# Patient Record
Sex: Male | Born: 1955 | Race: White | Hispanic: No | Smoking: Never smoker
Health system: Southern US, Community
[De-identification: ages and names within clinical notes are randomized; demographics above are authoritative.]

## PROBLEM LIST (undated history)

## (undated) DIAGNOSIS — E119 Type 2 diabetes mellitus without complications: Secondary | ICD-10-CM

## (undated) DIAGNOSIS — H269 Unspecified cataract: Secondary | ICD-10-CM

## (undated) DIAGNOSIS — M1990 Unspecified osteoarthritis, unspecified site: Secondary | ICD-10-CM

## (undated) DIAGNOSIS — I10 Essential (primary) hypertension: Secondary | ICD-10-CM

## (undated) DIAGNOSIS — E785 Hyperlipidemia, unspecified: Secondary | ICD-10-CM

## (undated) DIAGNOSIS — T7840XA Allergy, unspecified, initial encounter: Secondary | ICD-10-CM

## (undated) DIAGNOSIS — J40 Bronchitis, not specified as acute or chronic: Secondary | ICD-10-CM

## (undated) HISTORY — DX: Type 2 diabetes mellitus without complications: E11.9

## (undated) HISTORY — DX: Hyperlipidemia, unspecified: E78.5

## (undated) HISTORY — DX: Essential (primary) hypertension: I10

## (undated) HISTORY — PX: OTHER SURGICAL HISTORY: SHX169

## (undated) HISTORY — DX: Unspecified osteoarthritis, unspecified site: M19.90

## (undated) HISTORY — PX: WISDOM TOOTH EXTRACTION: SHX21

## (undated) HISTORY — DX: Allergy, unspecified, initial encounter: T78.40XA

## (undated) HISTORY — DX: Unspecified cataract: H26.9

---

## 2012-03-08 ENCOUNTER — Other Ambulatory Visit: Payer: Self-pay | Admitting: Family Medicine

## 2012-03-08 DIAGNOSIS — M79669 Pain in unspecified lower leg: Secondary | ICD-10-CM

## 2012-03-10 ENCOUNTER — Ambulatory Visit
Admission: RE | Admit: 2012-03-10 | Discharge: 2012-03-10 | Disposition: A | Discharge status: Home / Self Care | Payer: 59 | Source: Ambulatory Visit | Attending: Family Medicine | Admitting: Family Medicine

## 2012-03-10 DIAGNOSIS — M79669 Pain in unspecified lower leg: Secondary | ICD-10-CM

## 2013-06-05 ENCOUNTER — Encounter: Payer: Self-pay | Admitting: Internal Medicine

## 2013-08-03 ENCOUNTER — Encounter: Payer: 59 | Admitting: Internal Medicine

## 2014-04-12 ENCOUNTER — Other Ambulatory Visit: Payer: Self-pay | Admitting: Family Medicine

## 2014-04-12 DIAGNOSIS — G5691 Unspecified mononeuropathy of right upper limb: Secondary | ICD-10-CM

## 2014-04-22 ENCOUNTER — Ambulatory Visit
Admission: RE | Admit: 2014-04-22 | Discharge: 2014-04-22 | Disposition: A | Discharge status: Home / Self Care | Payer: 59 | Source: Ambulatory Visit | Attending: Family Medicine | Admitting: Family Medicine

## 2014-04-22 DIAGNOSIS — G5691 Unspecified mononeuropathy of right upper limb: Secondary | ICD-10-CM

## 2014-12-31 DIAGNOSIS — M79604 Pain in right leg: Secondary | ICD-10-CM | POA: Insufficient documentation

## 2014-12-31 DIAGNOSIS — G5691 Unspecified mononeuropathy of right upper limb: Secondary | ICD-10-CM | POA: Insufficient documentation

## 2014-12-31 DIAGNOSIS — I10 Essential (primary) hypertension: Secondary | ICD-10-CM | POA: Diagnosis present

## 2015-05-26 HISTORY — PX: COLONOSCOPY: SHX174

## 2016-01-14 ENCOUNTER — Ambulatory Visit (AMBULATORY_SURGERY_CENTER): Payer: Self-pay

## 2016-01-14 VITALS — Ht 74.0 in | Wt 285.8 lb

## 2016-01-14 DIAGNOSIS — Z800 Family history of malignant neoplasm of digestive organs: Secondary | ICD-10-CM

## 2016-01-14 MED ORDER — NA SULFATE-K SULFATE-MG SULF 17.5-3.13-1.6 GM/177ML PO SOLN: ORAL | 0 refills | Status: DC

## 2016-01-14 NOTE — Progress Notes (Signed)
Per pt, no allergies to soy or egg products.Pt not taking any weight loss meds or using  O2 at home. 

## 2016-01-16 ENCOUNTER — Encounter: Payer: Self-pay | Admitting: Gastroenterology

## 2016-01-28 ENCOUNTER — Encounter: Payer: Self-pay | Admitting: Gastroenterology

## 2016-01-28 ENCOUNTER — Ambulatory Visit (AMBULATORY_SURGERY_CENTER): Payer: 59 | Admitting: Gastroenterology

## 2016-01-28 VITALS — BP 110/57 | HR 51 | Temp 99.3°F | Resp 16 | Ht 74.0 in | Wt 285.0 lb

## 2016-01-28 DIAGNOSIS — D124 Benign neoplasm of descending colon: Secondary | ICD-10-CM

## 2016-01-28 DIAGNOSIS — D125 Benign neoplasm of sigmoid colon: Secondary | ICD-10-CM | POA: Diagnosis not present

## 2016-01-28 DIAGNOSIS — D123 Benign neoplasm of transverse colon: Secondary | ICD-10-CM | POA: Diagnosis not present

## 2016-01-28 DIAGNOSIS — Z1211 Encounter for screening for malignant neoplasm of colon: Secondary | ICD-10-CM

## 2016-01-28 DIAGNOSIS — Z800 Family history of malignant neoplasm of digestive organs: Secondary | ICD-10-CM

## 2016-01-28 DIAGNOSIS — D120 Benign neoplasm of cecum: Secondary | ICD-10-CM

## 2016-01-28 MED ORDER — SODIUM CHLORIDE 0.9 % IV SOLN: 500.0000 mL | INTRAVENOUS | Status: DC

## 2016-01-28 NOTE — Patient Instructions (Signed)
YOU HAD AN ENDOSCOPIC PROCEDURE TODAY AT [facility]:   Refer to the procedure report that was given to you for any specific questions about what was found during the examination.  If the procedure report does not answer your questions, please call your gastroenterologist to clarify.  If you requested that your care partner not be given the details of your procedure findings, then the procedure report has been included in a sealed envelope for you to review at your convenience later.  YOU SHOULD EXPECT: Some feelings of bloating in the abdomen. Passage of more gas than usual.  Walking can help get rid of the air that was put into your GI tract during the procedure and reduce the bloating. If you had a lower endoscopy (such as a colonoscopy or flexible sigmoidoscopy) you may notice spotting of blood in your stool or on the toilet paper. If you underwent a bowel prep for your procedure, you may not have a normal bowel movement for a few days.  Please Note:  You might notice some irritation and congestion in your nose or some drainage.  This is from the oxygen used during your procedure.  There is no need for concern and it should clear up in a day or so.  SYMPTOMS TO REPORT IMMEDIATELY:   Following lower endoscopy (colonoscopy or flexible sigmoidoscopy):  Excessive amounts of blood in the stool  Significant tenderness or worsening of abdominal pains  Swelling of the abdomen that is new, acute  Fever of 100F or higher   For urgent or emergent issues, a gastroenterologist can be reached at any hour by calling [phone].   DIET:  We do recommend a small meal at first, but then you may proceed to your regular diet.  Drink plenty of fluids but you should avoid alcoholic beverages for 24 hours. Try to increase your fiber intake due to your Diverticulosis.  Drink plenty of water.  ACTIVITY:  You should plan to take it easy for the rest of today and you should NOT DRIVE or use heavy  machinery until tomorrow (because of the sedation medicines used during the test).    FOLLOW UP: Our staff will call the number listed on your records the next business day following your procedure to check on you and address any questions or concerns that you may have regarding the information given to you following your procedure. If we do not reach you, we will leave a message.  However, if you are feeling well and you are not experiencing any problems, there is no need to return our call.  We will assume that you have returned to your regular daily activities without incident.  If any biopsies were taken you will be contacted by phone or by letter within the next 1-3 weeks.  Please call us at [phone] if you have not heard about the biopsies in 3 weeks.    SIGNATURES/CONFIDENTIALITY: You and/or your care partner have signed paperwork which will be entered into your electronic medical record.  These signatures attest to the fact that that the information above on your After Visit Summary has been reviewed and is understood.  Full responsibility of the confidentiality of this discharge information lies with you and/or your care-partner.  No Aspirin, Aleve or Ibuprofen for two weeks per Dr. Havery [name] due to the fact that he took one larger polyp out with cautery or heat.  We don't want the area to bleed. Thank-you for choosing [facility] for your  healthcare today.

## 2016-01-28 NOTE — Progress Notes (Signed)
A/ox3 pleased with MAC, report to Suzanne RN 

## 2016-01-28 NOTE — Progress Notes (Signed)
Called to room to assist during endoscopic procedure.  Patient ID and intended procedure confirmed with present staff. Received instructions for my participation in the procedure from the performing physician.  

## 2016-01-28 NOTE — Op Note (Signed)
[facility] Patient Name: Albert Mayer Procedure Date: 01/28/2016 1:55 PM MRN: [id number] Endoscopist: Remo Lipps P. Havery [name] , MD Age: 60 Referring MD:  Date of Birth: 02-24-1956 Gender: Male Account #: [id number] Procedure:                Colonoscopy Indications:              Screening patient at increased risk: Family history                            of 1st-degree relative (brother) with colorectal                            cancer diagnosed at age 24 years, This is the                            patient's first colonoscopy Medicines:                Monitored Anesthesia Care Procedure:                Pre-Anesthesia Assessment:                           - Prior to the procedure, a History and Physical                            was performed, and patient medications and                            allergies were reviewed. The patient's tolerance of                            previous anesthesia was also reviewed. The risks                            and benefits of the procedure and the sedation                            options and risks were discussed with the patient.                            All questions were answered, and informed consent                            was obtained. Prior Anticoagulants: The patient has                            taken aspirin, last dose was 1 day prior to                            procedure. ASA Grade Assessment: II - A patient                            with mild systemic disease. After reviewing the  risks and benefits, the patient was deemed in                            satisfactory condition to undergo the procedure.                           After obtaining informed consent, the colonoscope                            was passed under direct vision. Throughout the                            procedure, the patient's blood pressure, pulse, and                            oxygen saturations were monitored  continuously. The                            [id number]) was introduced through the anus                            and advanced to the the cecum, identified by                            appendiceal orifice and ileocecal valve. The                            colonoscopy was performed without difficulty. The                            patient tolerated the procedure well. The quality                            of the bowel preparation was adequate. The                            ileocecal valve, appendiceal orifice, and rectum                            were photographed. Scope In: 1:59:19 PM Scope Out: 2:26:10 PM Scope Withdrawal Time: 0 hours 22 minutes 7 seconds  Total Procedure Duration: 0 hours 26 minutes 51 seconds  Findings:                 The perianal and digital rectal examinations were                            normal.                           A 5 mm polyp was found in the cecum. The polyp was                            sessile. The polyp was removed with a cold snare.  Resection and retrieval were complete.                           A 5 mm polyp was found in the hepatic flexure. The                            polyp was sessile. The polyp was removed with a                            cold snare. Resection and retrieval were complete.                           A 5 mm polyp was found in the transverse colon. The                            polyp was sessile. The polyp was removed with a                            cold snare. Resection and retrieval were complete.                           A 5 mm polyp was found in the descending colon. The                            polyp was sessile. The polyp was removed with a                            cold snare. Resection and retrieval were complete.                           A 15 mm polyp was found in the sigmoid colon. The                            polyp was pedunculated. The polyp was removed with                             a hot snare. Resection and retrieval were complete.                           Multiple medium-mouthed diverticula were found in                            the left colon.                           Non-bleeding internal hemorrhoids were found during                            retroflexion.                           The colon was tortous in the right colon. The prep  was only fair in the right colon and time was spent                            lavaging the colon to achieve adequate views for                            screening purposes. The exam was otherwise without                            abnormality. Complications:            No immediate complications. Estimated blood loss:                            Minimal. Estimated Blood Loss:     Estimated blood loss was minimal. Impression:               - One 5 mm polyp in the cecum, removed with a cold                            snare. Resected and retrieved.                           - One 5 mm polyp at the hepatic flexure, removed                            with a cold snare. Resected and retrieved.                           - One 5 mm polyp in the transverse colon, removed                            with a cold snare. Resected and retrieved.                           - One 5 mm polyp in the descending colon, removed                            with a cold snare. Resected and retrieved.                           - One 15 mm polyp in the sigmoid colon, removed                            with a hot snare. Resected and retrieved.                           - Diverticulosis in the left colon.                           - Non-bleeding internal hemorrhoids.                           - The examination was otherwise normal. Recommendation:           -  Patient has a contact number available for                            emergencies. The signs and symptoms of potential                             delayed complications were discussed with the                            patient. Return to normal activities tomorrow.                            Written discharge instructions were provided to the                            patient.                           - Resume previous diet.                           - Continue present medications.                           - No ibuprofen, naproxen, or other non-steroidal                            anti-inflammatory drugs for 2 weeks after polyp                            removal.                           - Await pathology results.                           - Repeat colonoscopy is recommended for                            surveillance. The colonoscopy date will be                            determined after pathology results from today's                            exam become available for review. Remo Lipps P. Armbruster, MD 01/28/2016 2:32:37 PM This report has been signed electronically.

## 2016-01-29 ENCOUNTER — Telehealth: Payer: Self-pay | Admitting: *Deleted

## 2016-01-29 NOTE — Telephone Encounter (Signed)
  Follow up Call-  Call back number 01/28/2016  Post procedure Call Back phone  # [phone]  Permission to leave phone message Yes  Some recent data might be hidden     Patient questions:  Do you have a fever, pain , or abdominal swelling? No. Pain Score  0 *  Have you tolerated food without any problems? Yes.    Have you been able to return to your normal activities? Yes.    Do you have any questions about your discharge instructions: Diet   No. Medications  No. Follow up visit  No.  Do you have questions or concerns about your Care? No.  Actions: * If pain score is 4 or above: No action needed, pain <4.

## 2016-02-05 ENCOUNTER — Encounter: Payer: Self-pay | Admitting: Gastroenterology

## 2017-11-01 DIAGNOSIS — E7800 Pure hypercholesterolemia, unspecified: Secondary | ICD-10-CM | POA: Insufficient documentation

## 2018-05-04 DIAGNOSIS — N200 Calculus of kidney: Secondary | ICD-10-CM | POA: Insufficient documentation

## 2018-08-02 DIAGNOSIS — M5136 Other intervertebral disc degeneration, lumbar region: Secondary | ICD-10-CM | POA: Diagnosis present

## 2018-08-02 DIAGNOSIS — M545 Low back pain, unspecified: Secondary | ICD-10-CM | POA: Insufficient documentation

## 2018-08-02 DIAGNOSIS — E119 Type 2 diabetes mellitus without complications: Secondary | ICD-10-CM

## 2019-01-14 ENCOUNTER — Encounter: Payer: Self-pay | Admitting: Gastroenterology

## 2019-11-28 ENCOUNTER — Other Ambulatory Visit: Payer: Self-pay | Admitting: Specialist

## 2019-11-28 DIAGNOSIS — M48062 Spinal stenosis, lumbar region with neurogenic claudication: Secondary | ICD-10-CM

## 2019-11-29 ENCOUNTER — Telehealth: Payer: Self-pay

## 2019-11-29 NOTE — Telephone Encounter (Signed)
Spoke with patient to review his medication list and drug allergies before he is scheduled for a myelogram.  He was informed he will be here two hours, will need a driver and will need to be on strict bedrest (explained) for 24 hours after the procedure.  He has no meds to hold.

## 2019-12-07 ENCOUNTER — Ambulatory Visit
Admission: RE | Admit: 2019-12-07 | Discharge: 2019-12-07 | Disposition: A | Discharge status: Home / Self Care | Payer: Medicare Other | Source: Ambulatory Visit | Attending: Specialist | Admitting: Specialist

## 2019-12-07 DIAGNOSIS — M48062 Spinal stenosis, lumbar region with neurogenic claudication: Secondary | ICD-10-CM

## 2019-12-07 MED ORDER — DIAZEPAM 5 MG PO TABS
5.0000 mg | ORAL_TABLET | Freq: Once | ORAL | Status: AC
  Administered 2019-12-07: 10 mg via ORAL

## 2019-12-07 MED ORDER — IOPAMIDOL (ISOVUE-M 200) INJECTION 41%
18.0000 mL | Freq: Once | INTRAMUSCULAR | Status: AC
  Administered 2019-12-07: 18 mL via INTRATHECAL

## 2019-12-07 NOTE — Discharge Instructions (Signed)

## 2020-01-11 ENCOUNTER — Ambulatory Visit
Admission: EM | Admit: 2020-01-11 | Discharge: 2020-01-11 | Disposition: A | Discharge status: Home / Self Care | Payer: Medicare Other

## 2020-01-11 ENCOUNTER — Telehealth: Payer: Self-pay | Admitting: Gastroenterology

## 2020-01-11 ENCOUNTER — Other Ambulatory Visit: Payer: Self-pay

## 2020-01-11 NOTE — Telephone Encounter (Signed)
Lm on vm for patient to return call.  Patient's chart looks like he went to Urgent Care in [place] and was advised to go to ED for evaluation of abdominal pain.

## 2020-01-11 NOTE — ED Notes (Signed)
Patient is being discharged from the Urgent Care and sent to the Emergency Department via pov . Per Lollie Sails, patient is in need of higher level of care due to abd pain. Patient is aware and verbalizes understanding of plan of care. There were no vitals filed for this visit.

## 2020-01-11 NOTE — Telephone Encounter (Signed)
Pt is requesting a call back from a nurse to discuss being seen, pt is experiencing stomach pain for over a week, pt was advised to contact his GI to order a CT scan and possibly be seen sometime soon, nothing available, please advise.

## 2020-01-11 NOTE — ED Triage Notes (Signed)
abd pain in epigastric area that started a few days ago.  Denies constipation or urinary s/s.

## 2020-01-12 ENCOUNTER — Emergency Department (HOSPITAL_COMMUNITY): Payer: Medicare Other

## 2020-01-12 ENCOUNTER — Other Ambulatory Visit: Payer: Self-pay

## 2020-01-12 ENCOUNTER — Encounter (HOSPITAL_COMMUNITY): Payer: Self-pay | Admitting: Emergency Medicine

## 2020-01-12 ENCOUNTER — Inpatient Hospital Stay (HOSPITAL_COMMUNITY)
Admission: EM | Admit: 2020-01-12 | Discharge: 2020-01-18 | DRG: 871 | Disposition: A | Discharge status: Home Health | Payer: Medicare Other | Attending: Internal Medicine | Admitting: Internal Medicine

## 2020-01-12 DIAGNOSIS — K3533 Acute appendicitis with perforation and localized peritonitis, with abscess: Secondary | ICD-10-CM

## 2020-01-12 DIAGNOSIS — E861 Hypovolemia: Secondary | ICD-10-CM | POA: Diagnosis present

## 2020-01-12 DIAGNOSIS — R6520 Severe sepsis without septic shock: Secondary | ICD-10-CM | POA: Diagnosis present

## 2020-01-12 DIAGNOSIS — I10 Essential (primary) hypertension: Secondary | ICD-10-CM | POA: Diagnosis present

## 2020-01-12 DIAGNOSIS — R918 Other nonspecific abnormal finding of lung field: Secondary | ICD-10-CM | POA: Diagnosis present

## 2020-01-12 DIAGNOSIS — Z20822 Contact with and (suspected) exposure to covid-19: Secondary | ICD-10-CM | POA: Diagnosis present

## 2020-01-12 DIAGNOSIS — M5136 Other intervertebral disc degeneration, lumbar region: Secondary | ICD-10-CM | POA: Diagnosis present

## 2020-01-12 DIAGNOSIS — R945 Abnormal results of liver function studies: Secondary | ICD-10-CM | POA: Diagnosis present

## 2020-01-12 DIAGNOSIS — K8020 Calculus of gallbladder without cholecystitis without obstruction: Secondary | ICD-10-CM | POA: Diagnosis present

## 2020-01-12 DIAGNOSIS — A419 Sepsis, unspecified organism: Principal | ICD-10-CM | POA: Diagnosis present

## 2020-01-12 DIAGNOSIS — E1165 Type 2 diabetes mellitus with hyperglycemia: Secondary | ICD-10-CM | POA: Diagnosis present

## 2020-01-12 DIAGNOSIS — R188 Other ascites: Secondary | ICD-10-CM | POA: Diagnosis present

## 2020-01-12 DIAGNOSIS — G8929 Other chronic pain: Secondary | ICD-10-CM | POA: Diagnosis present

## 2020-01-12 DIAGNOSIS — Z885 Allergy status to narcotic agent status: Secondary | ICD-10-CM | POA: Diagnosis not present

## 2020-01-12 DIAGNOSIS — E785 Hyperlipidemia, unspecified: Secondary | ICD-10-CM | POA: Diagnosis present

## 2020-01-12 DIAGNOSIS — E119 Type 2 diabetes mellitus without complications: Secondary | ICD-10-CM | POA: Diagnosis not present

## 2020-01-12 DIAGNOSIS — E872 Acidosis: Secondary | ICD-10-CM | POA: Diagnosis present

## 2020-01-12 DIAGNOSIS — Z7984 Long term (current) use of oral hypoglycemic drugs: Secondary | ICD-10-CM | POA: Diagnosis not present

## 2020-01-12 DIAGNOSIS — R748 Abnormal levels of other serum enzymes: Secondary | ICD-10-CM

## 2020-01-12 DIAGNOSIS — R6510 Systemic inflammatory response syndrome (SIRS) of non-infectious origin without acute organ dysfunction: Secondary | ICD-10-CM

## 2020-01-12 DIAGNOSIS — Z6836 Body mass index (BMI) 36.0-36.9, adult: Secondary | ICD-10-CM

## 2020-01-12 DIAGNOSIS — K651 Peritoneal abscess: Secondary | ICD-10-CM

## 2020-01-12 DIAGNOSIS — M51369 Other intervertebral disc degeneration, lumbar region without mention of lumbar back pain or lower extremity pain: Secondary | ICD-10-CM | POA: Diagnosis present

## 2020-01-12 DIAGNOSIS — E860 Dehydration: Secondary | ICD-10-CM | POA: Diagnosis present

## 2020-01-12 DIAGNOSIS — R739 Hyperglycemia, unspecified: Secondary | ICD-10-CM | POA: Diagnosis not present

## 2020-01-12 DIAGNOSIS — E876 Hypokalemia: Secondary | ICD-10-CM | POA: Diagnosis present

## 2020-01-12 DIAGNOSIS — E871 Hypo-osmolality and hyponatremia: Secondary | ICD-10-CM | POA: Diagnosis present

## 2020-01-12 DIAGNOSIS — Z6835 Body mass index (BMI) 35.0-35.9, adult: Secondary | ICD-10-CM

## 2020-01-12 DIAGNOSIS — K760 Fatty (change of) liver, not elsewhere classified: Secondary | ICD-10-CM | POA: Diagnosis present

## 2020-01-12 DIAGNOSIS — D72829 Elevated white blood cell count, unspecified: Secondary | ICD-10-CM | POA: Diagnosis not present

## 2020-01-12 DIAGNOSIS — R5381 Other malaise: Secondary | ICD-10-CM | POA: Diagnosis not present

## 2020-01-12 DIAGNOSIS — Z79899 Other long term (current) drug therapy: Secondary | ICD-10-CM

## 2020-01-12 DIAGNOSIS — I700 Atherosclerosis of aorta: Secondary | ICD-10-CM | POA: Diagnosis present

## 2020-01-12 DIAGNOSIS — Z7982 Long term (current) use of aspirin: Secondary | ICD-10-CM

## 2020-01-12 LAB — CBC
HCT: 41.5 % (ref 39.0–52.0)
Hemoglobin: 14.5 g/dL (ref 13.0–17.0)
MCH: 30.4 pg (ref 26.0–34.0)
MCHC: 34.9 g/dL (ref 30.0–36.0)
MCV: 87 fL (ref 80.0–100.0)
Platelets: 417 10*3/uL — ABNORMAL HIGH (ref 150–400)
RBC: 4.77 MIL/uL (ref 4.22–5.81)
RDW: 12.9 % (ref 11.5–15.5)
WBC: 18.6 10*3/uL — ABNORMAL HIGH (ref 4.0–10.5)
nRBC: 0 % (ref 0.0–0.2)

## 2020-01-12 LAB — COMPREHENSIVE METABOLIC PANEL
ALT: 51 U/L — ABNORMAL HIGH (ref 0–44)
AST: 42 U/L — ABNORMAL HIGH (ref 15–41)
Albumin: 3.4 g/dL — ABNORMAL LOW (ref 3.5–5.0)
Alkaline Phosphatase: 90 U/L (ref 38–126)
Anion gap: 16 — ABNORMAL HIGH (ref 5–15)
BUN: 36 mg/dL — ABNORMAL HIGH (ref 8–23)
CO2: 22 mmol/L (ref 22–32)
Calcium: 8.6 mg/dL — ABNORMAL LOW (ref 8.9–10.3)
Chloride: 96 mmol/L — ABNORMAL LOW (ref 98–111)
Creatinine, Ser: 1.22 mg/dL (ref 0.61–1.24)
GFR calc Af Amer: >60 mL/min (ref >60)
GFR calc non Af Amer: >60 mL/min (ref >60)
Glucose, Bld: 200 mg/dL — ABNORMAL HIGH (ref 70–99)
Potassium: 3.4 mmol/L — ABNORMAL LOW (ref 3.5–5.1)
Sodium: 134 mmol/L — ABNORMAL LOW (ref 135–145)
Total Bilirubin: 2 mg/dL — ABNORMAL HIGH (ref 0.3–1.2)
Total Protein: 7 g/dL (ref 6.5–8.1)

## 2020-01-12 LAB — URINALYSIS, ROUTINE W REFLEX MICROSCOPIC
Glucose, UA: NEGATIVE mg/dL
Hgb urine dipstick: NEGATIVE
Ketones, ur: NEGATIVE mg/dL
Leukocytes,Ua: NEGATIVE
Nitrite: NEGATIVE
Protein, ur: 30 mg/dL — AB
Specific Gravity, Urine: 1.027 (ref 1.005–1.030)
pH: 5 (ref 5.0–8.0)

## 2020-01-12 LAB — ABO/RH: ABO/RH(D): B NEG

## 2020-01-12 LAB — LACTIC ACID, PLASMA
Lactic Acid, Venous: 5.1 mmol/L (ref 0.5–1.9)
Lactic Acid, Venous: 2.8 mmol/L (ref 0.5–1.9)

## 2020-01-12 LAB — PREALBUMIN: Prealbumin: 6.8 mg/dL — ABNORMAL LOW (ref 18–38)

## 2020-01-12 LAB — CBG MONITORING, ED
Glucose-Capillary: 187 mg/dL — ABNORMAL HIGH (ref 70–99)
Glucose-Capillary: 173 mg/dL — ABNORMAL HIGH (ref 70–99)

## 2020-01-12 LAB — TYPE AND SCREEN
ABO/RH(D): B NEG
Antibody Screen: NEGATIVE

## 2020-01-12 LAB — PROTIME-INR
INR: 1.3 — ABNORMAL HIGH (ref 0.8–1.2)
Prothrombin Time: 15.4 seconds — ABNORMAL HIGH (ref 11.4–15.2)

## 2020-01-12 LAB — HEMOGLOBIN A1C
Hgb A1c MFr Bld: 8.1 % — ABNORMAL HIGH (ref 4.8–5.6)
Mean Plasma Glucose: 185.77 mg/dL

## 2020-01-12 LAB — HIV ANTIBODY (ROUTINE TESTING W REFLEX): HIV Screen 4th Generation wRfx: NONREACTIVE

## 2020-01-12 MED ORDER — MENTHOL 3 MG MT LOZG
1.0000 | LOZENGE | OROMUCOSAL | Status: DC | PRN
  Filled 2020-01-12: qty 9

## 2020-01-12 MED ORDER — SODIUM CHLORIDE 0.9 % IV SOLN
8.0000 mg | Freq: Four times a day (QID) | INTRAVENOUS | Status: DC | PRN
  Filled 2020-01-12: qty 4

## 2020-01-12 MED ORDER — PIPERACILLIN-TAZOBACTAM 3.375 G IVPB 30 MIN
3.3750 g | Freq: Once | INTRAVENOUS | Status: AC
  Administered 2020-01-12: 3.375 g via INTRAVENOUS
  Filled 2020-01-12: qty 50

## 2020-01-12 MED ORDER — ALUM & MAG HYDROXIDE-SIMETH 200-200-20 MG/5ML PO SUSP: 30.0000 mL | Freq: Four times a day (QID) | ORAL | Status: DC | PRN

## 2020-01-12 MED ORDER — ONDANSETRON HCL 4 MG/2ML IJ SOLN
4.0000 mg | Freq: Four times a day (QID) | INTRAMUSCULAR | Status: DC | PRN
  Administered 2020-01-13 – 2020-01-17 (×7): 4 mg via INTRAVENOUS
  Filled 2020-01-12 (×8): qty 2

## 2020-01-12 MED ORDER — PHENOL 1.4 % MT LIQD
2.0000 | OROMUCOSAL | Status: DC | PRN
  Filled 2020-01-12: qty 177

## 2020-01-12 MED ORDER — ONDANSETRON HCL 4 MG/2ML IJ SOLN
4.0000 mg | Freq: Once | INTRAMUSCULAR | Status: AC
  Administered 2020-01-12: 4 mg via INTRAVENOUS
  Filled 2020-01-12: qty 2

## 2020-01-12 MED ORDER — PROCHLORPERAZINE EDISYLATE 10 MG/2ML IJ SOLN: 5.0000 mg | INTRAMUSCULAR | Status: DC | PRN

## 2020-01-12 MED ORDER — LACTATED RINGERS IV BOLUS
1000.0000 mL | Freq: Once | INTRAVENOUS | Status: AC
  Administered 2020-01-12: 1000 mL via INTRAVENOUS

## 2020-01-12 MED ORDER — METHOCARBAMOL 1000 MG/10ML IJ SOLN
1000.0000 mg | Freq: Four times a day (QID) | INTRAVENOUS | Status: DC | PRN
  Administered 2020-01-17: 1000 mg via INTRAVENOUS
  Filled 2020-01-12 (×2): qty 10

## 2020-01-12 MED ORDER — METOPROLOL TARTRATE 5 MG/5ML IV SOLN: 5.0000 mg | Freq: Four times a day (QID) | INTRAVENOUS | Status: DC | PRN

## 2020-01-12 MED ORDER — POTASSIUM CHLORIDE IN NACL 20-0.9 MEQ/L-% IV SOLN
INTRAVENOUS | Status: DC
  Filled 2020-01-12 (×2): qty 1000

## 2020-01-12 MED ORDER — PIPERACILLIN-TAZOBACTAM 4.5 G IVPB: 4.5000 g | Freq: Once | INTRAVENOUS | Status: DC

## 2020-01-12 MED ORDER — INSULIN ASPART 100 UNIT/ML ~~LOC~~ SOLN
0.0000 [IU] | SUBCUTANEOUS | Status: DC
  Administered 2020-01-12 (×2): 3 [IU] via SUBCUTANEOUS
  Administered 2020-01-13: 2 [IU] via SUBCUTANEOUS
  Administered 2020-01-14: 3 [IU] via SUBCUTANEOUS
  Administered 2020-01-14: 2 [IU] via SUBCUTANEOUS
  Administered 2020-01-14: 3 [IU] via SUBCUTANEOUS
  Filled 2020-01-12: qty 0.15

## 2020-01-12 MED ORDER — LIP MEDEX EX OINT
1.0000 "application " | TOPICAL_OINTMENT | Freq: Two times a day (BID) | CUTANEOUS | Status: DC
  Administered 2020-01-12 – 2020-01-18 (×7): 1 via TOPICAL
  Filled 2020-01-12 (×3): qty 7

## 2020-01-12 MED ORDER — IBUPROFEN 200 MG PO TABS
400.0000 mg | ORAL_TABLET | Freq: Once | ORAL | Status: AC
  Administered 2020-01-12: 400 mg via ORAL
  Filled 2020-01-12: qty 2

## 2020-01-12 MED ORDER — MAGIC MOUTHWASH
15.0000 mL | Freq: Four times a day (QID) | ORAL | Status: DC | PRN
  Filled 2020-01-12: qty 15

## 2020-01-12 MED ORDER — VANCOMYCIN HCL IN DEXTROSE 1-5 GM/200ML-% IV SOLN: 1000.0000 mg | Freq: Three times a day (TID) | INTRAVENOUS | Status: DC

## 2020-01-12 MED ORDER — PIPERACILLIN-TAZOBACTAM 3.375 G IVPB
3.3750 g | Freq: Three times a day (TID) | INTRAVENOUS | Status: DC
  Administered 2020-01-12 – 2020-01-18 (×17): 3.375 g via INTRAVENOUS
  Filled 2020-01-12 (×17): qty 50

## 2020-01-12 MED ORDER — POTASSIUM CHLORIDE 10 MEQ/100ML IV SOLN
10.0000 meq | INTRAVENOUS | Status: AC
  Administered 2020-01-12 (×4): 10 meq via INTRAVENOUS
  Filled 2020-01-12 (×4): qty 100

## 2020-01-12 MED ORDER — MORPHINE SULFATE (PF) 4 MG/ML IV SOLN
4.0000 mg | Freq: Once | INTRAVENOUS | Status: AC
  Administered 2020-01-12: 4 mg via INTRAVENOUS
  Filled 2020-01-12: qty 1

## 2020-01-12 MED ORDER — DIPHENHYDRAMINE HCL 50 MG/ML IJ SOLN: 12.5000 mg | Freq: Four times a day (QID) | INTRAMUSCULAR | Status: DC | PRN

## 2020-01-12 MED ORDER — SODIUM CHLORIDE 0.9 % IV BOLUS
1000.0000 mL | Freq: Once | INTRAVENOUS | Status: AC
  Administered 2020-01-12: 1000 mL via INTRAVENOUS

## 2020-01-12 MED ORDER — LACTATED RINGERS IV SOLN: INTRAVENOUS | Status: DC

## 2020-01-12 MED ORDER — SODIUM CHLORIDE 0.9 % IV BOLUS
500.0000 mL | Freq: Once | INTRAVENOUS | Status: AC
  Administered 2020-01-12: 500 mL via INTRAVENOUS

## 2020-01-12 MED ORDER — ACETAMINOPHEN 650 MG RE SUPP
650.0000 mg | Freq: Once | RECTAL | Status: AC
  Administered 2020-01-12: 650 mg via RECTAL
  Filled 2020-01-12: qty 1

## 2020-01-12 MED ORDER — LACTATED RINGERS IV BOLUS: 1000.0000 mL | Freq: Three times a day (TID) | INTRAVENOUS | Status: AC | PRN

## 2020-01-12 MED ORDER — HYDROMORPHONE HCL 1 MG/ML IJ SOLN
0.5000 mg | INTRAMUSCULAR | Status: DC | PRN
  Administered 2020-01-13: 1 mg via INTRAVENOUS
  Administered 2020-01-13: 2 mg via INTRAVENOUS
  Administered 2020-01-13: 1 mg via INTRAVENOUS
  Administered 2020-01-14: 2 mg via INTRAVENOUS
  Filled 2020-01-12: qty 1
  Filled 2020-01-12 (×2): qty 2
  Filled 2020-01-12: qty 1

## 2020-01-12 NOTE — ED Provider Notes (Signed)
[place] DEPT Provider Note   CSN: [id number] Arrival date & time: 01/12/20  1104     History Chief Complaint  Patient presents with  . Abdominal Pain    Albert Mayer is a 64 y.o. male with history of diabetes, HTN, HLD presents to ER for evaluation of abdominal cramping onset almost 7-10 days ago.  This has worsened since Thursday. Now severe, constant located around belly button but radiating all over abdomen. Worse with movement, palpation and after eating. States pain significantly increases almost immediately after eating.  Associated with subjective fevers, chills, fatigue, nausea, vomiting, diarrhea, generalized malaise, decreased appetite.  Vomiting is non bilious non bloody.   Diarrhea is loose, "dark" but not coffee grounds or tarry or bloody. One episode of vomiting and diarrhea today. Fully vaccinated for COVID. Took an anti-inflammatory medicine for some time recently for low back pain prescribed by Dr Maxie Better.  Does not think this was a steroid.  No history of GIB, ulcers.  No abdominal surgeries.  No dysuria. Denies alcohol or illicit drug use. Seen at urgent care and told he has blood in his stool, referred to ER for possible CT.   HPI     Past Medical History:  Diagnosis Date  . Allergy    seasonal  . Arthritis   . Diabetes mellitus without complication ([place])   . Hyperlipidemia   . Hypertension     Patient Active Problem List   Diagnosis Date Noted  . Hyperglycemia 01/12/2020  . Severe obesity (BMI 35.0-35.9 with comorbidity) ([place]) 01/12/2020  . Acute appendicitis with perforation and peritoneal abscess 01/12/2020  . Hypokalemia 01/12/2020  . Nonspecific pain in the lumbar region 08/02/2018  . Degeneration of lumbar intervertebral disc 08/02/2018  . Type 2 diabetes mellitus ([place]) 08/02/2018  . Low back pain 08/02/2018  . Kidney stone on left side 05/04/2018  . Pure hypercholesterolemia 11/01/2017  . Neuropathy of right upper  extremity 12/31/2014  . Primary hypertension 12/31/2014  . Right leg pain 12/31/2014    Past Surgical History:  Procedure Laterality Date  . WISDOM TOOTH EXTRACTION         Family History  Problem Relation Age of Onset  . Breast cancer Mother   . Liver cancer Mother   . Melanoma Father   . Leukemia Father   . Colon cancer Brother     Social History   Tobacco Use  . Smoking status: Never Smoker  . Smokeless tobacco: Never Used  Substance Use Topics  . Alcohol use: No  . Drug use: No    Home Medications Prior to Admission medications   Medication Sig Start Date End Date Taking? Authorizing Provider  amLODipine (NORVASC) 5 MG tablet Take 5 mg by mouth daily.    [provider]  aspirin EC 81 MG tablet Take 81 mg by mouth daily.    [provider]  atorvastatin (LIPITOR) 40 MG tablet Take 40 mg by mouth daily. 11/24/19   [provider]  gabapentin (NEURONTIN) 300 MG capsule Take 1,000 mg by mouth 3 (three) times daily.    [provider]  glipiZIDE (GLUCOTROL) 10 MG tablet Take 10 mg by mouth daily before breakfast.    [provider]  lisinopril-hydrochlorothiazide (PRINZIDE,ZESTORETIC) 20-12.5 MG tablet Take 1 tablet by mouth daily.    [provider]  loratadine (CLARITIN) 10 MG tablet Take 10 mg by mouth daily as needed for allergies.    [provider]  meloxicam (MOBIC) 15 MG  tablet Take 15 mg by mouth daily. 11/22/19   [provider]  metFORMIN (GLUCOPHAGE-XR) 500 MG 24 hr tablet Take 500 mg by mouth 2 (two) times daily. 08/29/19   [provider]  methocarbamol (ROBAXIN) 500 MG tablet Take 500 mg by mouth at bedtime. 11/29/19   [provider]  traMADol (ULTRAM) 50 MG tablet Take 50 mg by mouth every 6 (six) hours as needed. Patient not taking: Reported on 11/29/2019 10/19/19   [provider]    Allergies    Codeine  Review of Systems   Review of Systems    Constitutional: Positive for appetite change, chills, diaphoresis, fatigue and fever.  Gastrointestinal: Positive for abdominal pain, diarrhea, nausea and vomiting.  All other systems reviewed and are negative.   Physical Exam Updated Vital Signs BP 126/83   Pulse (!) 104   Temp (!) 101.1 F (38.4 C) (Rectal)   Resp (!) 21   Ht 6\' 2"  (1.88 m)   Wt 127 kg   SpO2 94%   BMI 35.95 kg/m   Physical Exam Vitals and nursing note reviewed.  Constitutional:      Appearance: He is well-developed. He is ill-appearing.     Comments: Appears uncomfortable. Wet rag over face. Sweaty. Feels warm to touch, oral temp 98.7   HENT:     Head: Normocephalic and atraumatic.     Nose: Nose normal.  Eyes:     Conjunctiva/sclera: Conjunctivae normal.  Cardiovascular:     Rate and Rhythm: Normal rate and regular rhythm.     Heart sounds: Normal heart sounds.  Pulmonary:     Effort: Pulmonary effort is normal.     Breath sounds: Normal breath sounds.  Abdominal:     General: There is distension.     Palpations: Abdomen is soft.     Tenderness: There is abdominal tenderness.     Comments: Protuberant/distended abdomen.  Diffuse tenderness with guarding worse around umbilicus.    Musculoskeletal:        General: Normal range of motion.     Cervical back: Normal range of motion.  Skin:    General: Skin is warm and dry.     Capillary Refill: Capillary refill takes less than 2 seconds.  Neurological:     Mental Status: He is alert.  Psychiatric:        Behavior: Behavior normal.     ED Results / Procedures / Treatments   Labs (all labs ordered are listed, but only abnormal results are displayed) Labs Reviewed  COMPREHENSIVE METABOLIC PANEL - Abnormal; Notable for the following components:      Result Value   Sodium 134 (*)    Potassium 3.4 (*)    Chloride 96 (*)    Glucose, Bld 200 (*)    BUN 36 (*)    Calcium 8.6 (*)    Albumin 3.4 (*)    AST 42 (*)    ALT 51 (*)    Total  Bilirubin 2.0 (*)    Anion gap 16 (*)    All other components within normal limits  CBC - Abnormal; Notable for the following components:   WBC 18.6 (*)    Platelets 417 (*)    All other components within normal limits  URINALYSIS, ROUTINE W REFLEX MICROSCOPIC - Abnormal; Notable for the following components:   Color, Urine AMBER (*)    Bilirubin Urine SMALL (*)    Protein, ur 30 (*)    Bacteria, UA RARE (*)  All other components within normal limits  URINE CULTURE  CULTURE, BLOOD (ROUTINE X 2)  CULTURE, BLOOD (ROUTINE X 2)  CULTURE, BLOOD (SINGLE)  LACTIC ACID, PLASMA  LACTIC ACID, PLASMA  MAGNESIUM  PHOSPHORUS  PREALBUMIN  PROTIME-INR  HEMOGLOBIN A1C  POC OCCULT BLOOD, ED  TYPE AND SCREEN  ABO/RH    EKG None  Radiology CT Abdomen Pelvis Wo Contrast  Result Date: 01/12/2020 CLINICAL DATA:  Severe abdominal pain and distention with nausea and vomiting EXAM: CT ABDOMEN AND PELVIS WITHOUT CONTRAST TECHNIQUE: Multidetector CT imaging of the abdomen and pelvis was performed following the standard protocol without IV contrast. COMPARISON:  None. FINDINGS: Technical note: Examination is degraded by patient motion artifact. Lower chest: 6 mm triangular right lower lobe subpleural nodule (series 2, image 9). 3 mm right lower lobe nodule (series 2, image 41). Lung bases are otherwise clear. Heart size is normal. Coronary artery calcification is present. Hepatobiliary: Mildly decreased attenuation of the hepatic parenchyma suggesting hepatic steatosis. No focal liver lesion is identified on noncontrast study. Small layering hyperdensity within the gallbladder compatible with cholelithiasis. No pericholecystic inflammatory changes. No biliary dilatation. Pancreas: Unremarkable. No pancreatic ductal dilatation or surrounding inflammatory changes. Spleen: Normal in size without focal abnormality. Adrenals/Urinary Tract: Adrenal glands are unremarkable. Kidneys are normal, without renal  calculi, focal lesion, or hydronephrosis. Bladder is unremarkable for the degree of distension. Stomach/Bowel: Thick walled fluid collection adjacent to the base of the cecum along the posterior margin measuring approximately 8.0 x 5.0 x 5.0 cm (series 3, image 67; series 6, image 70) with surrounding fat stranding. Small focus of air is present within the collection. Appendix is not definitively visualized. Remaining colon is within normal limits. Stomach and small bowel are unremarkable. No dilated loops of bowel to suggest obstruction. Vascular/Lymphatic: Scattered aortoiliac atherosclerotic calcifications without aneurysm. No abdominopelvic lymphadenopathy. Reproductive: Prostate is unremarkable. Other: No pneumoperitoneum.  No abdominal wall hernia. Musculoskeletal: Multilevel thoracolumbar spondylosis. No acute osseous findings. IMPRESSION: 1. Thick-walled fluid collection adjacent to the base of the cecum measuring up to 8.0 cm with surrounding fat stranding compatible with abscess. Appendix is not definitively visualized. Findings may be secondary to a focal colitis or appendicitis. A perforated colonic malignancy is not excluded. 2. Cholelithiasis without evidence of acute cholecystitis. 3. Hepatic steatosis. 4. Two small pulmonary nodules measuring up to 6 mm. Non-contrast chest CT at 3-6 months is recommended. If the nodules are stable at time of repeat CT, then future CT at 18-24 months (from today's scan) is considered optional for low-risk patients, but is recommended for high-risk patients. This recommendation follows the consensus statement: Guidelines for Management of Incidental Pulmonary Nodules Detected on CT Images: From the Fleischner Society 2017; Radiology 2017; 284:228-243. Aortic Atherosclerosis (ICD10-I70.0). These results were called by telephone at the time of interpretation on 01/12/2020 at 2:31 pm to provider Donney Rankins, [place], who verbally acknowledged these results. Electronically Signed    By: Davina Poke D.O.   On: 01/12/2020 14:31    Procedures .Critical Care Performed by: Kinnie Feil, PA-C Authorized by: Kinnie Feil, PA-C   Critical care provider statement:    Critical care time (minutes):  45   Critical care was necessary to treat or prevent imminent or life-threatening deterioration of the following conditions: SIRS, intraabdominal absces.   Critical care was time spent personally by me on the following activities:  Discussions with consultants, evaluation of patient's response to treatment, examination of patient, ordering and performing treatments and interventions, ordering and review  of laboratory studies, ordering and review of radiographic studies, pulse oximetry, re-evaluation of patient's condition, obtaining history from patient or surrogate, review of old charts and development of treatment plan with patient or surrogate   I assumed direction of critical care for this patient from another provider in my specialty: no     (including critical care time)  Medications Ordered in ED Medications  piperacillin-tazobactam (ZOSYN) IVPB 3.375 g (3.375 g Intravenous New Bag/Given 01/12/20 1503)  lactated ringers infusion (has no administration in time range)  vancomycin (VANCOCIN) IVPB 1000 mg/200 mL premix (has no administration in time range)  lactated ringers bolus 1,000 mL (has no administration in time range)  lactated ringers bolus 1,000 mL (has no administration in time range)  piperacillin-tazobactam (ZOSYN) IVPB 3.375 g (has no administration in time range)  potassium chloride 10 mEq in 100 mL IVPB (has no administration in time range)  HYDROmorphone (DILAUDID) injection 0.5-2 mg (has no administration in time range)  methocarbamol (ROBAXIN) 1,000 mg in dextrose 5 % 100 mL IVPB (has no administration in time range)  ondansetron (ZOFRAN) injection 4 mg (has no administration in time range)    Or  ondansetron (ZOFRAN) 8 mg in sodium chloride  0.9 % 50 mL IVPB (has no administration in time range)  prochlorperazine (COMPAZINE) injection 5-10 mg (has no administration in time range)  lip balm (CARMEX) ointment 1 application (has no administration in time range)  phenol (CHLORASEPTIC) mouth spray 2 spray (has no administration in time range)  menthol-cetylpyridinium (CEPACOL) lozenge 3 mg (has no administration in time range)  magic mouthwash (has no administration in time range)  alum & mag hydroxide-simeth (MAALOX/MYLANTA) 200-200-20 MG/5ML suspension 30 mL (has no administration in time range)  diphenhydrAMINE (BENADRYL) injection 12.5-25 mg (has no administration in time range)  metoprolol tartrate (LOPRESSOR) injection 5 mg (has no administration in time range)  insulin aspart (novoLOG) injection 0-15 Units (has no administration in time range)  morphine 4 MG/ML injection 4 mg (4 mg Intravenous Given 01/12/20 1418)  ondansetron (ZOFRAN) injection 4 mg (4 mg Intravenous Given 01/12/20 1417)  sodium chloride 0.9 % bolus 1,000 mL (1,000 mLs Intravenous New Bag/Given 01/12/20 1419)    ED Course  I have reviewed the triage vital signs and the nursing notes.  Pertinent labs & imaging results that were available during my care of the patient were reviewed by me and considered in my medical decision making (see chart for details).  Clinical Course as of Jan 12 1523  Fri Jan 12, 2020  1403 BUN(!): 36 [CG]  1404 AST(!): 42 [CG]  1404 ALT(!): 51 [CG]  1404 Total Bilirubin(!): 2.0 [CG]  1404 Anion gap(!): 16 [CG]  1404 Glucose(!): 200 [CG]  1404 WBC(!): 18.6 [CG]  1435 Temp(!): 101.1 F (38.4 C) [CG]  1435 Pulse Rate: 98 [CG]  1436 IMPRESSION: 1. Thick-walled fluid collection adjacent to the base of the cecum measuring up to 8.0 cm with surrounding fat stranding compatible with abscess. Appendix is not definitively visualized. Findings may be secondary to a focal colitis or appendicitis. A perforated colonic malignancy is not  excluded.  2. Cholelithiasis without evidence of acute cholecystitis. 3. Hepatic steatosis. 4. Two small pulmonary nodules measuring up to 6 mm. Non-contrast chest CT at 3-6 months is recommended. If the nodules are stable at time of repeat CT, then future CT at 18-24 months (from today's scan) is considered optional for low-risk patients, but is recommended for high-risk patients. This recommendation follows the consensus statement:  Guidelines for Management of Incidental Pulmonary Nodules Detected on CT Images: From the Fleischner Society 2017; Radiology 2017; 284:228-243.  Aortic Atherosclerosis (ICD10-I70.0).  These results were called by telephone at the time of interpretation on 01/12/2020 at 2:31 pm to provider Donney Rankins, [place], who verbally acknowledged these results.  CT Abdomen Pelvis Wo Contrast [CG]  1438 Sinus rhythm, APC, prolonged Qtc 566   ED EKG [CG]  1502 WBC, UA: 6-10 [CG]  1502 Bacteria, UA(!): RARE [CG]  1502 Squamous Epithelial / LPF: 0-5 [CG]    Clinical Course User Index [CG] Kinnie Feil, PA-C   MDM Rules/Calculators/A&P                          64 yo F here with abdominal pain, vomiting, diarrhea, fevers, chills, decreased appetite.   I obtained additional history from triage, nursing notes and review of medical chart.  Previous medical records available, nursing notes reviewed to obtain more history and assist with MDM  Seen at [facility] UC today and had UA with nitrites, positive fecal occult test.    Patient meets SIRS/sepsis criteria with rectal fever, leukocytosis.   Chief complain involves an extensive number of treatment options and is a complaint that carries with it a high risk of complications and morbidity and mortality.  Source of SIRS likely GI/intrabdominal. Ddx including appendicitis, cholecystitis, right sided colitis, perforated viscus.   Laboratory studies and imaging ordered in triage by triage RN.  I have personally visualized and  interpreted these.    Labs remarkable for leukocytosis WBC 18.3. Normal hemoglobin. Hyperglycemia 200 with AG 16, minimally elevated LFT. BUN 36.  UA not suggestive of infection.  EKG with prolonged QTC 566.   Obtained call from radiologist regarding CTAP which was ordered while patient was in the waiting room.  CTAP shows 8 cm abscess in cecum ?perforated appendicitis vs colitis vs malignancy, pulmonary nodules.   I have ordered antibiotics zosyn and vancomycin per pharmacy.  1 L IVF for hydration and hyperglycemia.  Morphine, zofran.    24: Spoke to general surgery who will see patient, they have ordered CT guided perc catheter placement.  Requesting admission by medicine team. Shared with EDP.   Final Clinical Impression(s) / ED Diagnoses Final diagnoses:  SIRS (systemic inflammatory response syndrome) [facility])    Rx / DC Orders ED Discharge Orders    None       Kinnie Feil, PA-C 01/12/20 [place], [place], DO 01/13/20 0700

## 2020-01-12 NOTE — H&P (Signed)
Chief Complaint: Patient was seen in consultation today for  Chief Complaint  Patient presents with  . Abdominal Pain    Referring Physician(s): Dr. Johney Maine  Supervising Physician: Jacqulynn Cadet  Patient Status: [facility]  History of Present Illness: Albert Mayer is a 64 y.o. male with a medical history that includes DM, HTN, HLD, chronic back pain and obesity. He presented to the ED today with abdominal pain of approximately 2 week's duration. In the ED he was febrile with a temp of 101, WBC 18. CT imaging showed acute findings.   CT abdomen/pelvis 01/12/20: IMPRESSION: 1. Thick-walled fluid collection adjacent to the base of the cecum measuring up to 8.0 cm with surrounding fat stranding compatible with abscess. Appendix is not definitively visualized. Findings may be secondary to a focal colitis or appendicitis. A perforated colonic malignancy is not excluded. 2. Cholelithiasis without evidence of acute cholecystitis. 3. Hepatic steatosis. 4. Two small pulmonary nodules measuring up to 6 mm. Non-contrast chest CT at 3-6 months is recommended. If the nodules are stable at time of repeat CT, then future CT at 18-24 months (from today's scan) is considered optional for low-risk patients, but is recommended for high-risk patients. This recommendation follows the consensus statement: Guidelines for Management of Incidental Pulmonary Nodules Detected on CT Images: From the Fleischner Society 2017; Radiology 2017; 284:228-243.  Interventional Radiology has been asked to evaluate this patient for an image-guided abdominal fluid collection aspiration with possible drain placement. This case has been reviewed and procedure approved by Dr. Laurence Ferrari.   Past Medical History:  Diagnosis Date  . Allergy    seasonal  . Arthritis   . Diabetes mellitus without complication ([place])   . Hyperlipidemia   . Hypertension     Past Surgical History:  Procedure Laterality Date  .  WISDOM TOOTH EXTRACTION      Allergies: Codeine  Medications: Prior to Admission medications   Medication Sig Start Date End Date Taking? Authorizing Provider  acetaminophen (TYLENOL) 500 MG tablet Take 500 mg by mouth every 6 (six) hours as needed for mild pain or headache.   Yes [provider]  amLODipine (NORVASC) 5 MG tablet Take 5 mg by mouth daily.   Yes [provider]  aspirin EC 81 MG tablet Take 81 mg by mouth daily.   Yes [provider]  atorvastatin (LIPITOR) 40 MG tablet Take 40 mg by mouth daily. 11/24/19  Yes [provider]  glipiZIDE (GLUCOTROL) 10 MG tablet Take 10 mg by mouth daily before breakfast.   Yes [provider]  lisinopril-hydrochlorothiazide (PRINZIDE,ZESTORETIC) 20-12.5 MG tablet Take 1 tablet by mouth daily.   Yes [provider]  LORazepam (ATIVAN) 1 MG tablet Take 1 mg by mouth every 8 (eight) hours as needed for anxiety.  11/29/19  Yes [provider]  metFORMIN (GLUCOPHAGE-XR) 500 MG 24 hr tablet Take 500 mg by mouth 2 (two) times daily. 08/29/19  Yes [provider]  methocarbamol (ROBAXIN) 500 MG tablet Take 500 mg by mouth every 6 (six) hours as needed for muscle spasms.  11/29/19  Yes [provider]     Family History  Problem Relation Age of Onset  . Breast cancer Mother   . Liver cancer Mother   . Melanoma Father   . Leukemia Father   . Colon cancer Brother     Social History   Socioeconomic History  . Marital status: Widowed    Spouse name: Not on file  . Number  of children: Not on file  . Years of education: Not on file  . Highest education level: Not on file  Occupational History  . Not on file  Tobacco Use  . Smoking status: Never Smoker  . Smokeless tobacco: Never Used  Substance and Sexual Activity  . Alcohol use: No  . Drug use: No  . Sexual activity: Not on file  Other Topics Concern  . Not on file  Social History Narrative  . Not on file    Social Determinants of Health   Financial Resource Strain:   . Difficulty of Paying Living Expenses: Not on file  Food Insecurity:   . Worried About Charity fundraiser in the Last Year: Not on file  . Ran Out of Food in the Last Year: Not on file  Transportation Needs:   . Lack of Transportation (Medical): Not on file  . Lack of Transportation (Non-Medical): Not on file  Physical Activity:   . Days of Exercise per Week: Not on file  . Minutes of Exercise per Session: Not on file  Stress:   . Feeling of Stress : Not on file  Social Connections:   . Frequency of Communication with Friends and Family: Not on file  . Frequency of Social Gatherings with Friends and Family: Not on file  . Attends Religious Services: Not on file  . Active Member of Clubs or Organizations: Not on file  . Attends Archivist Meetings: Not on file  . Marital Status: Not on file    Review of Systems: A 12 point ROS discussed and pertinent positives are indicated in the HPI above.  All other systems are negative.  Review of Systems  Constitutional: Positive for appetite change, fatigue and fever.  Respiratory: Negative for cough and shortness of breath.   Gastrointestinal: Positive for abdominal distention, abdominal pain, diarrhea and nausea.  Musculoskeletal: Negative for back pain.  Neurological: Negative for headaches.    Vital Signs: BP (!) 135/56   Pulse 100   Temp (!) 101.1 F (38.4 C) (Rectal)   Resp 18   Ht 6\' 2"  (1.88 m)   Wt 280 lb (127 kg)   SpO2 93%   BMI 35.95 kg/m   Physical Exam Constitutional:      Appearance: He is ill-appearing.  Cardiovascular:     Rate and Rhythm: Regular rhythm. Tachycardia present.     Heart sounds: Normal heart sounds.  Pulmonary:     Effort: Pulmonary effort is normal.     Breath sounds: Normal breath sounds.  Abdominal:     General: Bowel sounds are increased. There is distension.     Tenderness: There is abdominal tenderness.   Skin:    General: Skin is warm and dry.  Neurological:     Mental Status: He is alert and oriented to person, place, and time.     Imaging: CT Abdomen Pelvis Wo Contrast  Result Date: 01/12/2020 CLINICAL DATA:  Severe abdominal pain and distention with nausea and vomiting EXAM: CT ABDOMEN AND PELVIS WITHOUT CONTRAST TECHNIQUE: Multidetector CT imaging of the abdomen and pelvis was performed following the standard protocol without IV contrast. COMPARISON:  None. FINDINGS: Technical note: Examination is degraded by patient motion artifact. Lower chest: 6 mm triangular right lower lobe subpleural nodule (series 2, image 9). 3 mm right lower lobe nodule (series 2, image 41). Lung bases are otherwise clear. Heart size is normal. Coronary artery calcification is present. Hepatobiliary: Mildly decreased attenuation of the hepatic parenchyma  suggesting hepatic steatosis. No focal liver lesion is identified on noncontrast study. Small layering hyperdensity within the gallbladder compatible with cholelithiasis. No pericholecystic inflammatory changes. No biliary dilatation. Pancreas: Unremarkable. No pancreatic ductal dilatation or surrounding inflammatory changes. Spleen: Normal in size without focal abnormality. Adrenals/Urinary Tract: Adrenal glands are unremarkable. Kidneys are normal, without renal calculi, focal lesion, or hydronephrosis. Bladder is unremarkable for the degree of distension. Stomach/Bowel: Thick walled fluid collection adjacent to the base of the cecum along the posterior margin measuring approximately 8.0 x 5.0 x 5.0 cm (series 3, image 67; series 6, image 70) with surrounding fat stranding. Small focus of air is present within the collection. Appendix is not definitively visualized. Remaining colon is within normal limits. Stomach and small bowel are unremarkable. No dilated loops of bowel to suggest obstruction. Vascular/Lymphatic: Scattered aortoiliac atherosclerotic calcifications  without aneurysm. No abdominopelvic lymphadenopathy. Reproductive: Prostate is unremarkable. Other: No pneumoperitoneum.  No abdominal wall hernia. Musculoskeletal: Multilevel thoracolumbar spondylosis. No acute osseous findings. IMPRESSION: 1. Thick-walled fluid collection adjacent to the base of the cecum measuring up to 8.0 cm with surrounding fat stranding compatible with abscess. Appendix is not definitively visualized. Findings may be secondary to a focal colitis or appendicitis. A perforated colonic malignancy is not excluded. 2. Cholelithiasis without evidence of acute cholecystitis. 3. Hepatic steatosis. 4. Two small pulmonary nodules measuring up to 6 mm. Non-contrast chest CT at 3-6 months is recommended. If the nodules are stable at time of repeat CT, then future CT at 18-24 months (from today's scan) is considered optional for low-risk patients, but is recommended for high-risk patients. This recommendation follows the consensus statement: Guidelines for Management of Incidental Pulmonary Nodules Detected on CT Images: From the Fleischner Society 2017; Radiology 2017; 284:228-243. Aortic Atherosclerosis (ICD10-I70.0). These results were called by telephone at the time of interpretation on 01/12/2020 at 2:31 pm to provider Donney Rankins, [place], who verbally acknowledged these results. Electronically Signed   By: Davina Poke D.O.   On: 01/12/2020 14:31    Labs:  CBC: Recent Labs    01/12/20 1209  WBC 18.6*  HGB 14.5  HCT 41.5  PLT 417*    COAGS: No results for input(s): INR, APTT in the last 8760 hours.  BMP: Recent Labs    01/12/20 1209  NA 134*  K 3.4*  CL 96*  CO2 22  GLUCOSE 200*  BUN 36*  CALCIUM 8.6*  CREATININE 1.22  GFRNONAA >60  GFRAA >60    LIVER FUNCTION TESTS: Recent Labs    01/12/20 1209  BILITOT 2.0*  AST 42*  ALT 51*  ALKPHOS 90  PROT 7.0  ALBUMIN 3.4*    TUMOR MARKERS: No results for input(s): AFPTM, CEA, CA199, CHROMGRNA in the last 8760  hours.  Assessment and Plan:  Intra-abdominal abscess, likely secondary to perforated appendix: Albert Mayer, 64 year old male, is scheduled to be seen in IR possibly 01/13/20 for an image-guided abdominal fluid collection aspiration with possible drain placement.   Risks and benefits discussed with the patient including bleeding, infection, damage to adjacent structures, bowel perforation/fistula connection, and sepsis.  All of the patient's questions were answered, patient is agreeable to proceed.  The patient will be NPO after midnight. Labs have been ordered.   Consent signed and in chart.  Thank you for this interesting consult.  I greatly enjoyed meeting Albert Mayer and look forward to participating in their care.  A copy of this report was sent to the requesting provider on this date.  Electronically Signed:  Soyla Dryer, [place] [phone] 01/12/2020, 5:04 PM   I spent a total of 20 Minutes    in face to face in clinical consultation, greater than 50% of which was counseling/coordinating care for abdominal fluid collection aspiration with possible drain placement.

## 2020-01-12 NOTE — ED Notes (Signed)
Date and time results received: 01/12/20 1603   Test: Lactic acid  Critical Value: 5.1  Name of Provider Notified: Stefanie Libel MD   Orders Received? Or Actions Taken?:

## 2020-01-12 NOTE — H&P (Addendum)
History and Physical    Albert Mayer EHM:[id number] DOB: 11-02-1955 DOA: 01/12/2020  PCP: Christain Sacramento, MD Patient coming from: Home  Chief Complaint: Abdominal pain  HPI: Albert Mayer is a 64 y.o. male with history of NIDDM-2, HTN, HLD, chronic back pain and obesity presenting with abdominal pain.   Patient had abdominal pain for about 2 weeks.  Initially pain was on and off.  However, pain became constant and severe over the last 3 to 4 days.  He also had fever, chills, fatigue, nausea, vomiting and diarrhea.  He describes the pain as cramping and bloating.  Patient was 10/10 when it is severe.  Pain is diffuse mainly around his umbilicus.  Pain is worse with eating and movement.  He had an episode of emesis.  Emesis was nonbloody.  He also reports dark loose stool over the last 2 days.  He denies hematochezia.  He felt a little short of breath when he had chills.  He also reports some nasal congestion 2 days ago.  He denies chest pain, focal neuro symptoms, UTI symptoms but his urine looks dark.  He denies history of heart disease or heart failure.   Patient denies smoking cigarettes, drinking alcohol recreational drug use.  In ED, febrile to 101.  Tachycardic to 104.  Normotensive.  WBC 18. Na 134.  K3.4.  Glucose 200.  BUN 36. Cr 1.22.  AST 42.  ALT 51.  Total bili 2.0.  CT abdomen and pelvis concerning for RLQ intra-abdominal abscess measuring about 8 cm with surrounding fat stranding compatible with abscess, cholelithiasis, hepatic steatosis and 2 small pulmonary nodules measuring up to 6 mm.  General surgery consulted and recommended IR drainage.  Received a liter of LR.  Cultures obtained.  Started on IV Zosyn.  Lactic acid is pending.  Hospitalist service called for admission for sepsis due to intra-abdominal abscess/infection.   ROS All review of system negative except for pertinent positives and negatives as history of present illness above.  PMH Past Medical History:  Diagnosis Date    . Allergy    seasonal  . Arthritis   . Diabetes mellitus without complication ([place])   . Hyperlipidemia   . Hypertension    PSH Past Surgical History:  Procedure Laterality Date  . WISDOM TOOTH EXTRACTION     Fam HX Family History  Problem Relation Age of Onset  . Breast cancer Mother   . Liver cancer Mother   . Melanoma Father   . Leukemia Father   . Colon cancer Brother     Social Hx  reports that he has never smoked. He has never used smokeless tobacco. He reports that he does not drink alcohol and does not use drugs.  Allergy Allergies  Allergen Reactions  . Codeine Other (See Comments)    Felt jittery   Home Meds Prior to Admission medications   Medication Sig Start Date End Date Taking? Authorizing Provider  acetaminophen (TYLENOL) 500 MG tablet Take 500 mg by mouth every 6 (six) hours as needed for mild pain or headache.   Yes [provider]  amLODipine (NORVASC) 5 MG tablet Take 5 mg by mouth daily.   Yes [provider]  aspirin EC 81 MG tablet Take 81 mg by mouth daily.   Yes [provider]  atorvastatin (LIPITOR) 40 MG tablet Take 40 mg by mouth daily. 11/24/19  Yes [provider]  glipiZIDE (GLUCOTROL) 10 MG tablet Take 10 mg by mouth daily before breakfast.  Yes [provider]  lisinopril-hydrochlorothiazide (PRINZIDE,ZESTORETIC) 20-12.5 MG tablet Take 1 tablet by mouth daily.   Yes [provider]  LORazepam (ATIVAN) 1 MG tablet Take 1 mg by mouth every 8 (eight) hours as needed for anxiety.  11/29/19  Yes [provider]  metFORMIN (GLUCOPHAGE-XR) 500 MG 24 hr tablet Take 500 mg by mouth 2 (two) times daily. 08/29/19  Yes [provider]  methocarbamol (ROBAXIN) 500 MG tablet Take 500 mg by mouth every 6 (six) hours as needed for muscle spasms.  11/29/19  Yes [provider]    Physical Exam: Vitals:   01/12/20 1421 01/12/20 1432 01/12/20 1522 01/12/20 1620  BP:   126/83 (!)  135/56  Pulse:   (!) 104 100  Resp:   (!) 21 18  Temp:  (!) 101.1 F (38.4 C)    TempSrc:  Rectal    SpO2:   94% 93%  Weight: 127 kg     Height: 6\' 2"  (1.88 m)       GENERAL: Appears to be in distress due to pain.  Appears well.  HEENT: MMM.  Vision and hearing grossly intact.  NECK: Supple.  No apparent JVD.  RESP:  No IWOB. Good air movement bilaterally. CVS:  RRR. Heart sounds normal.  ABD/GI/GU: Bowel sounds present. Soft.  Diffusely tender with some rebound. MSK/EXT:  Moves extremities. No apparent deformity or edema.  SKIN: no apparent skin lesion or wound NEURO: Awake, alert and oriented appropriately.  No gross deficit.  PSYCH: Calm. Normal affect.   Personally Reviewed Radiological Exams CT Abdomen Pelvis Wo Contrast  Result Date: 01/12/2020 CLINICAL DATA:  Severe abdominal pain and distention with nausea and vomiting EXAM: CT ABDOMEN AND PELVIS WITHOUT CONTRAST TECHNIQUE: Multidetector CT imaging of the abdomen and pelvis was performed following the standard protocol without IV contrast. COMPARISON:  None. FINDINGS: Technical note: Examination is degraded by patient motion artifact. Lower chest: 6 mm triangular right lower lobe subpleural nodule (series 2, image 9). 3 mm right lower lobe nodule (series 2, image 41). Lung bases are otherwise clear. Heart size is normal. Coronary artery calcification is present. Hepatobiliary: Mildly decreased attenuation of the hepatic parenchyma suggesting hepatic steatosis. No focal liver lesion is identified on noncontrast study. Small layering hyperdensity within the gallbladder compatible with cholelithiasis. No pericholecystic inflammatory changes. No biliary dilatation. Pancreas: Unremarkable. No pancreatic ductal dilatation or surrounding inflammatory changes. Spleen: Normal in size without focal abnormality. Adrenals/Urinary Tract: Adrenal glands are unremarkable. Kidneys are normal, without renal calculi, focal lesion, or hydronephrosis.  Bladder is unremarkable for the degree of distension. Stomach/Bowel: Thick walled fluid collection adjacent to the base of the cecum along the posterior margin measuring approximately 8.0 x 5.0 x 5.0 cm (series 3, image 67; series 6, image 70) with surrounding fat stranding. Small focus of air is present within the collection. Appendix is not definitively visualized. Remaining colon is within normal limits. Stomach and small bowel are unremarkable. No dilated loops of bowel to suggest obstruction. Vascular/Lymphatic: Scattered aortoiliac atherosclerotic calcifications without aneurysm. No abdominopelvic lymphadenopathy. Reproductive: Prostate is unremarkable. Other: No pneumoperitoneum.  No abdominal wall hernia. Musculoskeletal: Multilevel thoracolumbar spondylosis. No acute osseous findings. IMPRESSION: 1. Thick-walled fluid collection adjacent to the base of the cecum measuring up to 8.0 cm with surrounding fat stranding compatible with abscess. Appendix is not definitively visualized. Findings may be secondary to a focal colitis or appendicitis. A perforated colonic malignancy is not excluded. 2. Cholelithiasis without evidence of acute cholecystitis. 3. Hepatic  steatosis. 4. Two small pulmonary nodules measuring up to 6 mm. Non-contrast chest CT at 3-6 months is recommended. If the nodules are stable at time of repeat CT, then future CT at 18-24 months (from today's scan) is considered optional for low-risk patients, but is recommended for high-risk patients. This recommendation follows the consensus statement: Guidelines for Management of Incidental Pulmonary Nodules Detected on CT Images: From the Fleischner Society 2017; Radiology 2017; 284:228-243. Aortic Atherosclerosis (ICD10-I70.0). These results were called by telephone at the time of interpretation on 01/12/2020 at 2:31 pm to provider Donney Rankins, [place], who verbally acknowledged these results. Electronically Signed   By: Davina Poke D.O.   On: 01/12/2020  14:31     Personally Reviewed Labs: CBC: Recent Labs  Lab 01/12/20 1209  WBC 18.6*  HGB 14.5  HCT 41.5  MCV 87.0  PLT 284*   Basic Metabolic Panel: Recent Labs  Lab 01/12/20 1209  NA 134*  K 3.4*  CL 96*  CO2 22  GLUCOSE 200*  BUN 36*  CREATININE 1.22  CALCIUM 8.6*   GFR: Estimated Creatinine Clearance: 85.5 mL/min (by C-G formula based on SCr of 1.22 mg/dL). Liver Function Tests: Recent Labs  Lab 01/12/20 1209  AST 42*  ALT 51*  ALKPHOS 90  BILITOT 2.0*  PROT 7.0  ALBUMIN 3.4*   No results for input(s): LIPASE, AMYLASE in the last 168 hours. No results for input(s): AMMONIA in the last 168 hours. Coagulation Profile: No results for input(s): INR, PROTIME in the last 168 hours. Cardiac Enzymes: No results for input(s): CKTOTAL, CKMB, CKMBINDEX, TROPONINI in the last 168 hours. BNP (last 3 results) No results for input(s): PROBNP in the last 8760 hours. HbA1C: No results for input(s): HGBA1C in the last 72 hours. CBG: Recent Labs  Lab 01/12/20 1558  GLUCAP 187*   Lipid Profile: No results for input(s): CHOL, HDL, LDLCALC, TRIG, CHOLHDL, LDLDIRECT in the last 72 hours. Thyroid Function Tests: No results for input(s): TSH, T4TOTAL, FREET4, T3FREE, THYROIDAB in the last 72 hours. Anemia Panel: No results for input(s): VITAMINB12, FOLATE, FERRITIN, TIBC, IRON, RETICCTPCT in the last 72 hours. Urine analysis:    Component Value Date/Time   COLORURINE AMBER (A) 01/12/2020 1406   APPEARANCEUR CLEAR 01/12/2020 1406   LABSPEC 1.027 01/12/2020 1406   PHURINE 5.0 01/12/2020 1406   GLUCOSEU NEGATIVE 01/12/2020 1406   HGBUR NEGATIVE 01/12/2020 1406   BILIRUBINUR SMALL (A) 01/12/2020 1406   KETONESUR NEGATIVE 01/12/2020 1406   PROTEINUR 30 (A) 01/12/2020 1406   NITRITE NEGATIVE 01/12/2020 1406   LEUKOCYTESUR NEGATIVE 01/12/2020 1406    Sepsis Labs:  Lactic acid 5.1.  Personally Reviewed EKG:  Twelve-lead EKG sinus rhythm with some  PACs.  Assessment/Plan Principal Problem:   Acute appendicitis with perforation and peritoneal abscess Active Problems:   Degeneration of lumbar intervertebral disc   Type 2 diabetes mellitus ([place])   Primary hypertension   Hyperglycemia   Severe obesity (BMI 35.0-35.9 with comorbidity) ([facility])   Hypokalemia   Sepsis ([place])   Severe sepsis with acute organ dysfunction ([facility])  Severe sepsis due to intra-abdominal abscesses-tachycardic and febrile with leukocytosis.  CT abdomen with intra-abdominal abscesses concerning for perforated appendicitis.  Has evidence of endorgan damage as evidenced by lactic acidosis to 5.1 and elevated liver enzymes.  Has received a liter of LR. -Admit to stepdown unit -Continue IV fluid resuscitation-will give additional LR, 2 L -Continue broad-spectrum antibiotics with IV Zosyn.  -Trend lactic acid and leukocytosis -General surgery consulted and recommended  IR drainage with percutaneous drain placement which seems to have been scheduled for tomorrow.   NIDDM-2 with hyperglycemia: On Metformin and glipizide at home. -Check hemoglobin A1c -SSI moderate every 4 hours while n.p.o.  Hypokalemia-could be due to GI loss.  He is also on lisinopril and HCTZ which could contribute. -Replenish and recheck  Mild hyponatremia-could be due to HCTZ -Recheck in the morning  Azotemia-unclear etiology.  He reports dark stool.  GIB? -Recheck in the morning  Elevated liver enzymes/total bilirubin-likely due to sepsis as above -Continue monitoring  Leukocytosis: Likely left shift in the setting of sepsis -Treat sepsis as above -Continue monitoring  Essential hypertension: Normotensive. -Hold antihypertensive medication in the setting of sepsis -IV fluid as above  Chronic back pain -As needed pain medications  Pulmonary nodules: Incidental finding. -Repeat CT in 3 to 6 months recommended  Morbid obesity: BMI 35.95 and patient with history of  diabetes -Outpatient follow-up -Could benefit from GLP-1 inhibitors  DVT prophylaxis: SCD for planned procedure  Code Status: Full code Family Communication: Updated patient's sister at bedside.  Disposition Plan: Admit to stepdown unit Consults called: General surgery and IR Admission status: Inpatient.  Severe sepsis with intra-abdominal abscesses.   Mercy Riding MD [facility]  If 7PM-7AM, please contact night-coverage [email or web address]  01/12/2020, 4:32 PM

## 2020-01-12 NOTE — Telephone Encounter (Signed)
Patient is in the ED now.  Was transported by EMS.  Will await a return call from the patient.

## 2020-01-12 NOTE — ED Provider Notes (Signed)
Medical screening examination/treatment/procedure(s) were conducted as a shared visit with non-physician practitioner(s) and myself.  I personally evaluated the patient during the encounter. Briefly, the patient is a 64 y.o. male is a 64 year old male with history of hypertension, high cholesterol, diabetes who presents to the ED with abdominal pain.  Patient febrile but otherwise normal vitals.  Has been having ongoing abdominal pain for the past week but got worse over the last several days.  He points mostly to his bellybutton area where his pain is.  He is distended and has diffuse tenderness on exam.  He appears uncomfortable.  Lab work is significant for leukocytosis of 18.  Overall lab work and imaging are done prior to my evaluation that is significant for 8 cm abscess in the right lower abdomen by the cecum.  Could be from the appendix or from colitis or from malignancy.  Overall patient is septic and will give IV fluids, IV antibiotics and touch base with general surgery for further recommendations.  Patient to be admitted.  This chart was dictated using voice recognition software.  Despite best efforts to proofread,  errors can occur which can change the documentation meaning.       EKG Interpretation None           Lennice Sites, DO 01/12/20 1446

## 2020-01-12 NOTE — Consult Note (Signed)
Albert Mayer Oct 01, 1955  [id number].    Requesting MD: Dr. Ronnald Nian Chief Complaint/Reason for Consult: Intra-abdominal abscess  HPI: Albert Mayer is a 64 y.o. male with a history of DM2, HTN and HLD who presented to [facility] from PCPs office for abdominal pain.  Patient reports that he had intermittent generalized abdominal pain and bloating that began roughly 2 weeks ago and became more constant 10 days ago.  He reports that his symptoms are initially mild to moderate and associated with subjective fevers, chills and nausea but worsened on Wednesday night/early Thursday morning when his pain became severe.  He is unable to tell me where his pain is most severe on his abdomen.  Nausea has persisted and now associated with burping/belching and one episode of emesis.  He tried OTC pain medication for this at home without any relief.  He reports he did have an episode of dark, loose stools yesterday but no hematochezia or melena.  He reports he was seen by his PCP in [place] who did a fecal occult that was positive and referred him to the ED.  In the ED patient noted to be in SIRS with fever 101.1, HR 104, RR21. No hypotension, or hypoxia. WBC 18.6.  Slightly elevated LFTs. T bili 2.0.  CT with 8.0cm intra-abdominal abscess adjacent to the base of the cecum suspected to be 2/2 focal colitis or appendicitis. A perforated colonic malignancy is not excluded.  ROS: Review of Systems  Constitutional: Positive for chills and fever.  Respiratory: Positive for shortness of breath. Negative for cough.   Cardiovascular: Negative for chest pain and leg swelling.  Gastrointestinal: Positive for abdominal pain, blood in stool, nausea and vomiting. Negative for constipation and melena.  Genitourinary: Negative for dysuria.  Musculoskeletal: Negative for back pain.  Psychiatric/Behavioral: Negative for substance abuse.  All other systems reviewed and are negative.   Family History  Problem Relation Age of  Onset  . Breast cancer Mother   . Liver cancer Mother   . Melanoma Father   . Leukemia Father   . Colon cancer Brother     Past Medical History:  Diagnosis Date  . Allergy    seasonal  . Arthritis   . Diabetes mellitus without complication ([place])   . Hyperlipidemia   . Hypertension     Past Surgical History:  Procedure Laterality Date  . WISDOM TOOTH EXTRACTION      Social History:  reports that he has never smoked. He has never used smokeless tobacco. He reports that he does not drink alcohol and does not use drugs. Retired Never smoker No alcohol use No illicit drug use   Allergies:  Allergies  Allergen Reactions  . Codeine Other (See Comments)    Felt jittery    (Not in a hospital admission)    Physical Exam: Blood pressure (!) 121/94, pulse 98, temperature (!) 101.1 F (38.4 C), temperature source Rectal, resp. rate 20, height 6\' 2"  (1.88 m), weight 127 kg, SpO2 100 %. General: pleasant, WD/WN white male who is laying in bed in NAD HEENT: head is normocephalic, atraumatic.  Sclera are noninjected.  PERRL.  Ears and nose without any masses or lesions.  Mouth is pink and moist. Dentition fair Heart: regular, rate, and rhythm.  Normal s1,s2. No obvious murmurs, gallops, or rubs noted.  Palpable pedal pulses bilaterally  Lungs: CTAB, no wheezes, rhonchi, or rales noted.  Respiratory effort nonlabored Abd: Soft, moderate distension, generalized tenderness greatest in the right lower quadrant  without rebound, rigidity or guarding to light or deep palpation, +BS, no masses, hernias, or organomegaly. No prior abdominal scars. Negative Murphy's sign.  MS: no BUE/BLE edema, calves soft and nontender Skin: warm and dry with no masses, lesions, or rashes Psych: A&Ox4 with an appropriate affect Neuro: cranial nerves grossly intact, equal strength in BUE/BLE bilaterally, normal speech, though process intact  Results for orders placed or performed during the hospital  encounter of 01/12/20 (from the past 48 hour(s))  Comprehensive metabolic panel     Status: Abnormal   Collection Time: 01/12/20 12:09 PM  Result Value Ref Range   Sodium 134 (L) 135 - 145 mmol/L   Potassium 3.4 (L) 3.5 - 5.1 mmol/L   Chloride 96 (L) 98 - 111 mmol/L   CO2 22 22 - 32 mmol/L   Glucose, Bld 200 (H) 70 - 99 mg/dL    Comment: Glucose reference range applies only to samples taken after fasting for at least 8 hours.   BUN 36 (H) 8 - 23 mg/dL   Creatinine, Ser 1.22 0.61 - 1.24 mg/dL   Calcium 8.6 (L) 8.9 - 10.3 mg/dL   Total Protein 7.0 6.5 - 8.1 g/dL   Albumin 3.4 (L) 3.5 - 5.0 g/dL   AST 42 (H) 15 - 41 U/L   ALT 51 (H) 0 - 44 U/L   Alkaline Phosphatase 90 38 - 126 U/L   Total Bilirubin 2.0 (H) 0.3 - 1.2 mg/dL   GFR calc non Af Amer >60 >60 mL/min   GFR calc Af Amer >60 >60 mL/min   Anion gap 16 (H) 5 - 15    Comment: Performed at [facility], [place] [address], [place], [place] [address]  CBC     Status: Abnormal   Collection Time: 01/12/20 12:09 PM  Result Value Ref Range   WBC 18.6 (H) 4.0 - 10.5 K/uL   RBC 4.77 4.22 - 5.81 MIL/uL   Hemoglobin 14.5 13.0 - 17.0 g/dL   HCT 41.5 39 - 52 %   MCV 87.0 80.0 - 100.0 fL   MCH 30.4 26.0 - 34.0 pg   MCHC 34.9 30.0 - 36.0 g/dL   RDW 12.9 11.5 - 15.5 %   Platelets 417 (H) 150 - 400 K/uL   nRBC 0.0 0.0 - 0.2 %    Comment: Performed at [facility], [place] [address] [place], [place] [address]  Type and screen [place]     Status: None   Collection Time: 01/12/20 12:10 PM  Result Value Ref Range   ABO/RH(D) B NEG    Antibody Screen NEG    Sample Expiration      01/15/2020,2359 Performed at [facility], [place] [address] [place], [place] [address]   ABO/Rh     Status: None   Collection Time: 01/12/20 12:10 PM  Result Value Ref Range   ABO/RH(D)      B NEG Performed at [facility] [address], [place], [place] [address]     Urinalysis, Routine w reflex microscopic     Status: Abnormal   Collection Time: 01/12/20  2:06 PM  Result Value Ref Range   Color, Urine AMBER (A) YELLOW    Comment: BIOCHEMICALS MAY BE AFFECTED BY COLOR   APPearance CLEAR CLEAR   Specific Gravity, Urine 1.027 1.005 - 1.030   pH 5.0 5.0 - 8.0   Glucose, UA NEGATIVE NEGATIVE mg/dL   Hgb urine dipstick NEGATIVE NEGATIVE  Bilirubin Urine SMALL (A) NEGATIVE   Ketones, ur NEGATIVE NEGATIVE mg/dL   Protein, ur 30 (A) NEGATIVE mg/dL   Nitrite NEGATIVE NEGATIVE   Leukocytes,Ua NEGATIVE NEGATIVE   RBC / HPF 0-5 0 - 5 RBC/hpf   WBC, UA 6-10 0 - 5 WBC/hpf   Bacteria, UA RARE (A) NONE SEEN   Squamous Epithelial / LPF 0-5 0 - 5   Mucus PRESENT    Hyaline Casts, UA PRESENT     Comment: Performed at [facility] [place] [address] [place], [place] [address]   CT Abdomen Pelvis Wo Contrast  Result Date: 01/12/2020 CLINICAL DATA:  Severe abdominal pain and distention with nausea and vomiting EXAM: CT ABDOMEN AND PELVIS WITHOUT CONTRAST TECHNIQUE: Multidetector CT imaging of the abdomen and pelvis was performed following the standard protocol without IV contrast. COMPARISON:  None. FINDINGS: Technical note: Examination is degraded by patient motion artifact. Lower chest: 6 mm triangular right lower lobe subpleural nodule (series 2, image 9). 3 mm right lower lobe nodule (series 2, image 41). Lung bases are otherwise clear. Heart size is normal. Coronary artery calcification is present. Hepatobiliary: Mildly decreased attenuation of the hepatic parenchyma suggesting hepatic steatosis. No focal liver lesion is identified on noncontrast study. Small layering hyperdensity within the gallbladder compatible with cholelithiasis. No pericholecystic inflammatory changes. No biliary dilatation. Pancreas: Unremarkable. No pancreatic ductal dilatation or surrounding inflammatory changes. Spleen: Normal in size without focal abnormality.  Adrenals/Urinary Tract: Adrenal glands are unremarkable. Kidneys are normal, without renal calculi, focal lesion, or hydronephrosis. Bladder is unremarkable for the degree of distension. Stomach/Bowel: Thick walled fluid collection adjacent to the base of the cecum along the posterior margin measuring approximately 8.0 x 5.0 x 5.0 cm (series 3, image 67; series 6, image 70) with surrounding fat stranding. Small focus of air is present within the collection. Appendix is not definitively visualized. Remaining colon is within normal limits. Stomach and small bowel are unremarkable. No dilated loops of bowel to suggest obstruction. Vascular/Lymphatic: Scattered aortoiliac atherosclerotic calcifications without aneurysm. No abdominopelvic lymphadenopathy. Reproductive: Prostate is unremarkable. Other: No pneumoperitoneum.  No abdominal wall hernia. Musculoskeletal: Multilevel thoracolumbar spondylosis. No acute osseous findings. IMPRESSION: 1. Thick-walled fluid collection adjacent to the base of the cecum measuring up to 8.0 cm with surrounding fat stranding compatible with abscess. Appendix is not definitively visualized. Findings may be secondary to a focal colitis or appendicitis. A perforated colonic malignancy is not excluded. 2. Cholelithiasis without evidence of acute cholecystitis. 3. Hepatic steatosis. 4. Two small pulmonary nodules measuring up to 6 mm. Non-contrast chest CT at 3-6 months is recommended. If the nodules are stable at time of repeat CT, then future CT at 18-24 months (from today's scan) is considered optional for low-risk patients, but is recommended for high-risk patients. This recommendation follows the consensus statement: Guidelines for Management of Incidental Pulmonary Nodules Detected on CT Images: From the Fleischner Society 2017; Radiology 2017; 284:228-243. Aortic Atherosclerosis (ICD10-I70.0). These results were called by telephone at the time of interpretation on 01/12/2020 at 2:31 pm  to provider Donney Rankins, [place], who verbally acknowledged these results. Electronically Signed   By: Davina Poke D.O.   On: 01/12/2020 14:31   Anti-infectives (From admission, onward)   Start     Dose/Rate Route Frequency Ordered Stop   01/12/20 2200  piperacillin-tazobactam (ZOSYN) IVPB 3.375 g        3.375 g 12.5 mL/hr over 240 Minutes Intravenous Every 8 hours 01/12/20 1459     01/12/20 1600  vancomycin (VANCOCIN) IVPB 1000 mg/200 mL premix        1,000 mg 200 mL/hr over 60 Minutes Intravenous Every 8 hours 01/12/20 1453 01/13/20 0559   01/12/20 1445  piperacillin-tazobactam (ZOSYN) IVPB 4.5 g  Status:  Discontinued        4.5 g 200 mL/hr over 30 Minutes Intravenous  Once 01/12/20 1434 01/12/20 1436   01/12/20 1445  piperacillin-tazobactam (ZOSYN) IVPB 3.375 g        3.375 g 100 mL/hr over 30 Minutes Intravenous  Once 01/12/20 1436         Assessment/Plan DM2 HTN HLD Reports + FOBT prior to arrival - hgb 14.5 Cholelithiasis w/ elevated LFTs without evidence of acute cholecystitis on imaging. Repeat labs in the AM. Pulm Nodules - will need outpatient follow up   Sepsis  Intra-abdominal abscess likely 2/2 perforated appendicitis  - CT w/ 8.0cm intra-abdominal abscess adjacent to the base of the cecum suspected to be 2/2 focal colitis or appendicitis. A perforated colonic malignancy is not excluded. - No indication for emergency surgery. Will plan for IR consultation for possible drainage and treatment with IV abx and bowel rest. Patient may require interval appendectomy in ~6 weeks after inflammation has improved. Given CT scan findings, he may benefit from follow up with GI to discuss repeat colonoscopy in the near future.    FEN - NPO, IVF VTE - SCDs, okay for chemical prophylaxis from a general surgery standpoint ID - Zosyn. Will d/c Vanc.   Jillyn Ledger, [facility] 01/12/2020, 3:19 PM Please see Amion for pager number during day hours 7:00am-4:30pm

## 2020-01-12 NOTE — ED Triage Notes (Signed)
Per EMS-states abdominal pain for 10 days saw PCP today-positive guaiac-sent here for further follow up

## 2020-01-12 NOTE — ED Notes (Signed)
Pt is too weak to stand up for orthostatic vital will try later.

## 2020-01-12 NOTE — Progress Notes (Signed)
A consult was received from an ED physician for vancomycin per pharmacy dosing (for an indication other than meningitis). The patient's profile has been reviewed for ht/wt/allergies/indication/available labs. A one time order has been placed for the above antibiotics.  Further antibiotics/pharmacy consults should be ordered by admitting physician if indicated.                       Reuel Boom, PharmD, BCPS [phone] 01/12/2020, 2:53 PM

## 2020-01-13 ENCOUNTER — Inpatient Hospital Stay (HOSPITAL_COMMUNITY): Payer: Medicare Other

## 2020-01-13 DIAGNOSIS — E119 Type 2 diabetes mellitus without complications: Secondary | ICD-10-CM

## 2020-01-13 DIAGNOSIS — D72829 Elevated white blood cell count, unspecified: Secondary | ICD-10-CM | POA: Insufficient documentation

## 2020-01-13 LAB — CBC
HCT: 35.9 % — ABNORMAL LOW (ref 39.0–52.0)
Hemoglobin: 12.3 g/dL — ABNORMAL LOW (ref 13.0–17.0)
MCH: 30.5 pg (ref 26.0–34.0)
MCHC: 34.3 g/dL (ref 30.0–36.0)
MCV: 89.1 fL (ref 80.0–100.0)
Platelets: 254 10*3/uL (ref 150–400)
RBC: 4.03 MIL/uL — ABNORMAL LOW (ref 4.22–5.81)
RDW: 13.5 % (ref 11.5–15.5)
WBC: 15.2 10*3/uL — ABNORMAL HIGH (ref 4.0–10.5)
nRBC: 0 % (ref 0.0–0.2)

## 2020-01-13 LAB — CBG MONITORING, ED
Glucose-Capillary: 111 mg/dL — ABNORMAL HIGH (ref 70–99)
Glucose-Capillary: 118 mg/dL — ABNORMAL HIGH (ref 70–99)
Glucose-Capillary: 145 mg/dL — ABNORMAL HIGH (ref 70–99)
Glucose-Capillary: 55 mg/dL — ABNORMAL LOW (ref 70–99)
Glucose-Capillary: 67 mg/dL — ABNORMAL LOW (ref 70–99)
Glucose-Capillary: 88 mg/dL (ref 70–99)
Glucose-Capillary: 88 mg/dL (ref 70–99)

## 2020-01-13 LAB — PROTIME-INR
INR: 1.3 — ABNORMAL HIGH (ref 0.8–1.2)
Prothrombin Time: 15.9 seconds — ABNORMAL HIGH (ref 11.4–15.2)

## 2020-01-13 LAB — COMPREHENSIVE METABOLIC PANEL
ALT: 46 U/L — ABNORMAL HIGH (ref 0–44)
AST: 49 U/L — ABNORMAL HIGH (ref 15–41)
Albumin: 2.6 g/dL — ABNORMAL LOW (ref 3.5–5.0)
Alkaline Phosphatase: 79 U/L (ref 38–126)
Anion gap: 13 (ref 5–15)
BUN: 36 mg/dL — ABNORMAL HIGH (ref 8–23)
CO2: 23 mmol/L (ref 22–32)
Calcium: 7.9 mg/dL — ABNORMAL LOW (ref 8.9–10.3)
Chloride: 101 mmol/L (ref 98–111)
Creatinine, Ser: 1.16 mg/dL (ref 0.61–1.24)
GFR calc Af Amer: >60 mL/min (ref >60)
GFR calc non Af Amer: >60 mL/min (ref >60)
Glucose, Bld: 121 mg/dL — ABNORMAL HIGH (ref 70–99)
Potassium: 3.5 mmol/L (ref 3.5–5.1)
Sodium: 137 mmol/L (ref 135–145)
Total Bilirubin: 2.4 mg/dL — ABNORMAL HIGH (ref 0.3–1.2)
Total Protein: 5.8 g/dL — ABNORMAL LOW (ref 6.5–8.1)

## 2020-01-13 LAB — PROCALCITONIN: Procalcitonin: 75.31 ng/mL

## 2020-01-13 LAB — CORTISOL-AM, BLOOD: Cortisol - AM: 31.2 ug/dL — ABNORMAL HIGH (ref 6.7–22.6)

## 2020-01-13 LAB — SARS CORONAVIRUS 2 BY RT PCR (HOSPITAL ORDER, PERFORMED IN ~~LOC~~ HOSPITAL LAB): SARS Coronavirus 2: NEGATIVE

## 2020-01-13 MED ORDER — FENTANYL CITRATE (PF) 100 MCG/2ML IJ SOLN
INTRAMUSCULAR | Status: AC
  Filled 2020-01-13: qty 4

## 2020-01-13 MED ORDER — MIDAZOLAM HCL 2 MG/2ML IJ SOLN
INTRAMUSCULAR | Status: AC | PRN
  Administered 2020-01-13: 0.5 mg via INTRAVENOUS

## 2020-01-13 MED ORDER — PROMETHAZINE HCL 25 MG/ML IJ SOLN
INTRAMUSCULAR | Status: AC
  Administered 2020-01-13: 12.5 mg via INTRAVENOUS
  Filled 2020-01-13: qty 1

## 2020-01-13 MED ORDER — KETOROLAC TROMETHAMINE 30 MG/ML IJ SOLN
INTRAMUSCULAR | Status: AC | PRN
  Administered 2020-01-13: 30 mg via INTRA_ARTICULAR

## 2020-01-13 MED ORDER — MIDAZOLAM HCL 2 MG/2ML IJ SOLN
INTRAMUSCULAR | Status: AC
  Filled 2020-01-13: qty 4

## 2020-01-13 MED ORDER — DEXTROSE 50 % IV SOLN
INTRAVENOUS | Status: AC
  Administered 2020-01-13: 50 mL
  Filled 2020-01-13: qty 50

## 2020-01-13 MED ORDER — FENTANYL CITRATE (PF) 100 MCG/2ML IJ SOLN
INTRAMUSCULAR | Status: AC | PRN
  Administered 2020-01-13: 25 ug via INTRAVENOUS

## 2020-01-13 MED ORDER — KCL IN DEXTROSE-NACL 40-5-0.9 MEQ/L-%-% IV SOLN
INTRAVENOUS | Status: DC
  Filled 2020-01-13 (×4): qty 1000

## 2020-01-13 MED ORDER — KETOROLAC TROMETHAMINE 30 MG/ML IJ SOLN
INTRAMUSCULAR | Status: AC
  Filled 2020-01-13: qty 1

## 2020-01-13 MED ORDER — POTASSIUM CHLORIDE 10 MEQ/100ML IV SOLN
10.0000 meq | INTRAVENOUS | Status: AC
  Administered 2020-01-13 (×4): 10 meq via INTRAVENOUS
  Filled 2020-01-13 (×3): qty 100

## 2020-01-13 MED ORDER — DEXTROSE 50 % IV SOLN
INTRAVENOUS | Status: AC
  Administered 2020-01-13: 25 mL
  Filled 2020-01-13: qty 50

## 2020-01-13 NOTE — Progress Notes (Signed)
Progress Note: General Surgery Service   Chief Complaint/Subjective: Pain persistent throughout abdomen, no vomiting overnight, did not sleep well in ED  Objective: Vital signs in last 24 hours: Temp:  [98 F (36.7 C)-101.7 F (38.7 C)] 99.6 F (37.6 C) (08/21 0628) Pulse Rate:  [42-119] 61 (08/21 0745) Resp:  [18-35] 22 (08/21 0745) BP: (110-164)/(44-111) 128/69 (08/21 0745) SpO2:  [89 %-100 %] 96 % (08/21 0745) Weight:  [086 kg] 127 kg (08/20 1421)    Intake/Output from previous day: 08/20 0701 - 08/21 0700 In: 1743.2 [I.V.:820.7; IV Piggyback:922.5] Out: -  Intake/Output this shift: No intake/output data recorded.  Gen: NAD  Resp: nonlabored  Card: RRR  Abd: soft, obesity, tender throughout  Lab Results: CBC  Recent Labs    01/12/20 1209 01/13/20 0300  WBC 18.6* 15.2*  HGB 14.5 12.3*  HCT 41.5 35.9*  PLT 417* 254   BMET Recent Labs    01/12/20 1209 01/13/20 0300  NA 134* 137  K 3.4* 3.5  CL 96* 101  CO2 22 23  GLUCOSE 200* 121*  BUN 36* 36*  CREATININE 1.22 1.16  CALCIUM 8.6* 7.9*   PT/INR Recent Labs    01/12/20 1456 01/13/20 0300  LABPROT 15.4* 15.9*  INR 1.3* 1.3*   ABG No results for input(s): PHART, HCO3 in the last 72 hours.  Invalid input(s): PCO2, PO2  Anti-infectives: Anti-infectives (From admission, onward)   Start     Dose/Rate Route Frequency Ordered Stop   01/12/20 2200  piperacillin-tazobactam (ZOSYN) IVPB 3.375 g        3.375 g 12.5 mL/hr over 240 Minutes Intravenous Every 8 hours 01/12/20 1459     01/12/20 1600  vancomycin (VANCOCIN) IVPB 1000 mg/200 mL premix  Status:  Discontinued        1,000 mg 200 mL/hr over 60 Minutes Intravenous Every 8 hours 01/12/20 1453 01/12/20 1543   01/12/20 1445  piperacillin-tazobactam (ZOSYN) IVPB 4.5 g  Status:  Discontinued        4.5 g 200 mL/hr over 30 Minutes Intravenous  Once 01/12/20 1434 01/12/20 1436   01/12/20 1445  piperacillin-tazobactam (ZOSYN) IVPB 3.375 g        3.375  g 100 mL/hr over 30 Minutes Intravenous  Once 01/12/20 1436 01/12/20 1618      Medications: Scheduled Meds:  insulin aspart  0-15 Units Subcutaneous Q4H   lip balm  1 application Topical BID   Continuous Infusions:  0.9 % NaCl with KCl 20 mEq / L 100 mL/hr at 01/13/20 0501   lactated ringers     methocarbamol (ROBAXIN) IV     ondansetron (ZOFRAN) IV     piperacillin-tazobactam (ZOSYN)  IV 3.375 g (01/13/20 0629)   PRN Meds:.alum & mag hydroxide-simeth, diphenhydrAMINE, HYDROmorphone (DILAUDID) injection, lactated ringers, magic mouthwash, menthol-cetylpyridinium, methocarbamol (ROBAXIN) IV, metoprolol tartrate, ondansetron (ZOFRAN) IV **OR** ondansetron (ZOFRAN) IV, phenol, prochlorperazine  Assessment/Plan: 64 yo male with acute appendicitis with perforation -IV abx -plan for IR drainage procedure today -continue bowel rest   LOS: 1 day   Mickeal Skinner, MD [facility]

## 2020-01-13 NOTE — Progress Notes (Signed)
PROGRESS NOTE    Albert Mayer  IDP:[id number] DOB: 1955-06-11 DOA: 01/12/2020 PCP: Christain Sacramento, MD    Chief Complaint  Patient presents with  . Abdominal Pain    Brief Narrative:  Patient 64 year old gentleman history of non-insulin-dependent diabetes mellitus type 2, hypertension, hyperlipidemia, chronic back pain, obesity presenting with a 2-week history of worsening abdominal pain, fever, chills, fatigue, nausea, vomiting.  Work-up in the ED CT abdomen and pelvis concerning for acute perforated appendicitis with abscess formation.  Patient placed empirically on IV Zosyn.  General surgery consulted who recommended IR for drain placement.   Assessment & Plan:   Principal Problem:   Acute appendicitis with perforation and peritoneal abscess Active Problems:   Degeneration of lumbar intervertebral disc   Type 2 diabetes mellitus (HCC)   Primary hypertension   Hyperglycemia   Severe obesity (BMI 35.0-35.9 with comorbidity) (HCC)   Hypokalemia   Sepsis ([place])   Severe sepsis with acute organ dysfunction (HCC)  1 severe sepsis secondary to intra-abdominal perforated appendicitis with abscess formation Patient presented with a 2-week history of worsening abdominal pain.  CT abdomen and pelvis done with intra-abdominal abscesses concerning for perforated appendicitis.  Patient met criteria for sepsis with tachycardia, fever, leukocytosis, evidence of endorgan damage by elevated lactic acidosis of 5.1, transaminitis, elevated procalcitonin level.  Patient with a T-max of 101.7 overnight.  Leukocytosis trending down.  Patient pancultured with blood cultures pending.  Urine cultures pending.  Patient still with ongoing significant abdominal pain.  Patient seen in consultation by general surgery who are recommending IR placement of drain with interval appendectomy in 6 weeks.  Continue IV Zosyn.  N.p.o.  Patient for drain placement today per IR.  Per general surgery.  2.  Non-insulin-dependent  diabetes mellitus type 2 with hyperglycemia Hemoglobin A1c 8.1 (01/12/2020).  CBG of 55 this morning.  Patient currently n.p.o. change IV fluids to D5 normal saline with KCl at 125 cc/h.  CBG every 4 hours.  Hold oral hypoglycemic agents.  Sliding scale insulin.  3.  Hypokalemia Likely secondary to GI losses.  Patient currently n.p.o.  Place KCl and IV fluids.  KCl 10 mEq every hour x4 runs.  Check a magnesium level.  Follow.  4.  Mild hyponatremia Likely secondary to hypovolemic hyponatremia secondary to problem #1 in the setting of HCTZ.  HCTZ discontinued.  Sodium levels improved with hydration.  Follow.  5.  Elevated liver enzymes/transaminitis Likely secondary to problem #1.  Follow.  6.  Leukocytosis Secondary to problem #1.  Patient pancultured.  Continue IV Zosyn.  7.  Hypertension Continue to hold antihypertensive medications for now.  IV fluids.  Follow.  8.  Chronic back pain Continue current pain regimen.  9.  Pulmonary nodules Incidental finding.  Will need repeat CT in 3 to 6 months.  10.  Morbid obesity Outpatient follow-up.   DVT prophylaxis: SCDs Code Status: Full Family Communication: Updated patient.  No family at bedside. Disposition:   Status is: Inpatient    Dispo: The patient is from: Home              Anticipated d/c is to: Home              Anticipated d/c date is: To be determined.              Patient currently receiving IV antibiotics, admitted for probable perforated appendicitis with abscess.  Awaiting drain placement per IR.  General surgery following.  Not medically stable for discharge.  Consultants:   General surgery: Dr. Johney Maine 01/12/2020  Interventional radiology: Dr. Jacqulynn Cadet 01/12/2020  Procedures:   CT abdomen and pelvis 01/12/2020    Antimicrobials:   IV Zosyn 01/12/2020>>>   Subjective: Laying on his right side.  States just does not feel too well today.  Complaining of diffuse abdominal pain with no  significant improvement.  No nausea or emesis.  No chest pain or shortness of breath.  States she has got some pain medication.  Stated he is received both vaccines of the Covid.  Objective: Vitals:   01/13/20 0840 01/13/20 0934 01/13/20 1034 01/13/20 1039  BP: 130/73 115/69  (!) 145/83  Pulse: 65 (!) 59 67 66  Resp: (!) 25 20 (!) 25 (!) 22  Temp:      TempSrc:      SpO2: 93% 94% 93% 92%  Weight:      Height:        Intake/Output Summary (Last 24 hours) at 01/13/2020 1045 Last data filed at 01/13/2020 0300 Gross per 24 hour  Intake 1743.19 ml  Output --  Net 1743.19 ml   Filed Weights   01/12/20 1421  Weight: 127 kg    Examination:  General exam: Mild distress. Respiratory system: Clear to auscultation.  No wheezes, no crackles, no rhonchi.  Respiratory effort normal. Cardiovascular system: S1 & S2 heard, RRR. No JVD, murmurs, rubs, gallops or clicks. No pedal edema. Gastrointestinal system: Abdomen is obese, distended, soft, diffuse tenderness to palpation, hypoactive bowel sounds.  No rebound.  No guarding. Central nervous system: Alert and oriented. No focal neurological deficits. Extremities: Symmetric 5 x 5 power. Skin: No rashes, lesions or ulcers Psychiatry: Judgement and insight appear normal. Mood & affect appropriate.     Data Reviewed: I have personally reviewed following labs and imaging studies  CBC: Recent Labs  Lab 01/12/20 1209 01/13/20 0300  WBC 18.6* 15.2*  HGB 14.5 12.3*  HCT 41.5 35.9*  MCV 87.0 89.1  PLT 417* 751    Basic Metabolic Panel: Recent Labs  Lab 01/12/20 1209 01/13/20 0300  NA 134* 137  K 3.4* 3.5  CL 96* 101  CO2 22 23  GLUCOSE 200* 121*  BUN 36* 36*  CREATININE 1.22 1.16  CALCIUM 8.6* 7.9*    GFR: Estimated Creatinine Clearance: 89.9 mL/min (by C-G formula based on SCr of 1.16 mg/dL).  Liver Function Tests: Recent Labs  Lab 01/12/20 1209 01/13/20 0300  AST 42* 49*  ALT 51* 46*  ALKPHOS 90 79  BILITOT 2.0*  2.4*  PROT 7.0 5.8*  ALBUMIN 3.4* 2.6*    CBG: Recent Labs  Lab 01/12/20 1944 01/13/20 0016 01/13/20 0457 01/13/20 0745 01/13/20 0845  GLUCAP 173* 145* 88 55* 118*     Recent Results (from the past 240 hour(s))  Blood culture (routine x 2)     Status: None (Preliminary result)   Collection Time: 01/12/20  2:33 PM   Specimen: BLOOD  Result Value Ref Range Status   Specimen Description   Final    BLOOD RIGHT ANTECUBITAL Performed at [facility], [place] [address] [place], [place] [address]    Special Requests   Final    BOTTLES DRAWN AEROBIC AND ANAEROBIC Blood Culture results may not be optimal due to an excessive volume of blood received in culture bottles Performed at [facility] [address] [place], [place] [address]    Culture PENDING  Incomplete   Report Status PENDING  Incomplete  Radiology Studies: CT Abdomen Pelvis Wo Contrast  Result Date: 01/12/2020 CLINICAL DATA:  Severe abdominal pain and distention with nausea and vomiting EXAM: CT ABDOMEN AND PELVIS WITHOUT CONTRAST TECHNIQUE: Multidetector CT imaging of the abdomen and pelvis was performed following the standard protocol without IV contrast. COMPARISON:  None. FINDINGS: Technical note: Examination is degraded by patient motion artifact. Lower chest: 6 mm triangular right lower lobe subpleural nodule (series 2, image 9). 3 mm right lower lobe nodule (series 2, image 41). Lung bases are otherwise clear. Heart size is normal. Coronary artery calcification is present. Hepatobiliary: Mildly decreased attenuation of the hepatic parenchyma suggesting hepatic steatosis. No focal liver lesion is identified on noncontrast study. Small layering hyperdensity within the gallbladder compatible with cholelithiasis. No pericholecystic inflammatory changes. No biliary dilatation. Pancreas: Unremarkable. No pancreatic ductal dilatation or surrounding inflammatory changes. Spleen: Normal in  size without focal abnormality. Adrenals/Urinary Tract: Adrenal glands are unremarkable. Kidneys are normal, without renal calculi, focal lesion, or hydronephrosis. Bladder is unremarkable for the degree of distension. Stomach/Bowel: Thick walled fluid collection adjacent to the base of the cecum along the posterior margin measuring approximately 8.0 x 5.0 x 5.0 cm (series 3, image 67; series 6, image 70) with surrounding fat stranding. Small focus of air is present within the collection. Appendix is not definitively visualized. Remaining colon is within normal limits. Stomach and small bowel are unremarkable. No dilated loops of bowel to suggest obstruction. Vascular/Lymphatic: Scattered aortoiliac atherosclerotic calcifications without aneurysm. No abdominopelvic lymphadenopathy. Reproductive: Prostate is unremarkable. Other: No pneumoperitoneum.  No abdominal wall hernia. Musculoskeletal: Multilevel thoracolumbar spondylosis. No acute osseous findings. IMPRESSION: 1. Thick-walled fluid collection adjacent to the base of the cecum measuring up to 8.0 cm with surrounding fat stranding compatible with abscess. Appendix is not definitively visualized. Findings may be secondary to a focal colitis or appendicitis. A perforated colonic malignancy is not excluded. 2. Cholelithiasis without evidence of acute cholecystitis. 3. Hepatic steatosis. 4. Two small pulmonary nodules measuring up to 6 mm. Non-contrast chest CT at 3-6 months is recommended. If the nodules are stable at time of repeat CT, then future CT at 18-24 months (from today's scan) is considered optional for low-risk patients, but is recommended for high-risk patients. This recommendation follows the consensus statement: Guidelines for Management of Incidental Pulmonary Nodules Detected on CT Images: From the Fleischner Society 2017; Radiology 2017; 284:228-243. Aortic Atherosclerosis (ICD10-I70.0). These results were called by telephone at the time of  interpretation on 01/12/2020 at 2:31 pm to provider Donney Rankins, [place], who verbally acknowledged these results. Electronically Signed   By: Davina Poke D.O.   On: 01/12/2020 14:31        Scheduled Meds: . insulin aspart  0-15 Units Subcutaneous Q4H  . lip balm  1 application Topical BID   Continuous Infusions: . dextrose 5 % and 0.9 % NaCl with KCl 40 mEq/L 125 mL/hr at 01/13/20 1006  . lactated ringers    . methocarbamol (ROBAXIN) IV    . ondansetron (ZOFRAN) IV    . piperacillin-tazobactam (ZOSYN)  IV 3.375 g (01/13/20 0629)  . potassium chloride 10 mEq (01/13/20 1009)     LOS: 1 day    Time spent: 40 minutes    Irine Seal, MD [facility]   To contact the attending provider between 7A-7P or the covering provider during after hours 7P-7A, please log into the web site [email or web address] and access using universal [place] password for that web site. If you do not have the password, please  call the hospital operator.  01/13/2020, 10:45 AM

## 2020-01-14 LAB — COMPREHENSIVE METABOLIC PANEL
ALT: 51 U/L — ABNORMAL HIGH (ref 0–44)
AST: 57 U/L — ABNORMAL HIGH (ref 15–41)
Albumin: 2.6 g/dL — ABNORMAL LOW (ref 3.5–5.0)
Alkaline Phosphatase: 111 U/L (ref 38–126)
Anion gap: 8 (ref 5–15)
BUN: 31 mg/dL — ABNORMAL HIGH (ref 8–23)
CO2: 24 mmol/L (ref 22–32)
Calcium: 7.7 mg/dL — ABNORMAL LOW (ref 8.9–10.3)
Chloride: 105 mmol/L (ref 98–111)
Creatinine, Ser: 0.85 mg/dL (ref 0.61–1.24)
GFR calc Af Amer: >60 mL/min (ref >60)
GFR calc non Af Amer: >60 mL/min (ref >60)
Glucose, Bld: 207 mg/dL — ABNORMAL HIGH (ref 70–99)
Potassium: 4.2 mmol/L (ref 3.5–5.1)
Sodium: 137 mmol/L (ref 135–145)
Total Bilirubin: 1.8 mg/dL — ABNORMAL HIGH (ref 0.3–1.2)
Total Protein: 5.9 g/dL — ABNORMAL LOW (ref 6.5–8.1)

## 2020-01-14 LAB — LACTIC ACID, PLASMA: Lactic Acid, Venous: <0.3 mmol/L — ABNORMAL LOW (ref 0.5–1.9)

## 2020-01-14 LAB — CBC WITH DIFFERENTIAL/PLATELET
Abs Immature Granulocytes: 0.13 10*3/uL — ABNORMAL HIGH (ref 0.00–0.07)
Basophils Absolute: 0 10*3/uL (ref 0.0–0.1)
Basophils Relative: 0 %
Eosinophils Absolute: 0.1 10*3/uL (ref 0.0–0.5)
Eosinophils Relative: 0 %
HCT: 39.2 % (ref 39.0–52.0)
Hemoglobin: 13.4 g/dL (ref 13.0–17.0)
Immature Granulocytes: 1 %
Lymphocytes Relative: 7 %
Lymphs Abs: 0.8 10*3/uL (ref 0.7–4.0)
MCH: 30.5 pg (ref 26.0–34.0)
MCHC: 34.2 g/dL (ref 30.0–36.0)
MCV: 89.3 fL (ref 80.0–100.0)
Monocytes Absolute: 1.7 10*3/uL — ABNORMAL HIGH (ref 0.1–1.0)
Monocytes Relative: 15 %
Neutro Abs: 8.9 10*3/uL — ABNORMAL HIGH (ref 1.7–7.7)
Neutrophils Relative %: 77 %
Platelets: 228 10*3/uL (ref 150–400)
RBC: 4.39 MIL/uL (ref 4.22–5.81)
RDW: 13.5 % (ref 11.5–15.5)
WBC: 11.7 10*3/uL — ABNORMAL HIGH (ref 4.0–10.5)
nRBC: 0 % (ref 0.0–0.2)

## 2020-01-14 LAB — CBG MONITORING, ED
Glucose-Capillary: 147 mg/dL — ABNORMAL HIGH (ref 70–99)
Glucose-Capillary: 192 mg/dL — ABNORMAL HIGH (ref 70–99)
Glucose-Capillary: 175 mg/dL — ABNORMAL HIGH (ref 70–99)
Glucose-Capillary: 163 mg/dL — ABNORMAL HIGH (ref 70–99)
Glucose-Capillary: 165 mg/dL — ABNORMAL HIGH (ref 70–99)
Glucose-Capillary: 134 mg/dL — ABNORMAL HIGH (ref 70–99)

## 2020-01-14 LAB — PHOSPHORUS: Phosphorus: 2.2 mg/dL — ABNORMAL LOW (ref 2.5–4.6)

## 2020-01-14 LAB — MAGNESIUM: Magnesium: 2.3 mg/dL (ref 1.7–2.4)

## 2020-01-14 LAB — URINE CULTURE: Culture: <10000 — AB

## 2020-01-14 LAB — PROCALCITONIN: Procalcitonin: 35.39 ng/mL

## 2020-01-14 MED ORDER — AMLODIPINE BESYLATE 5 MG PO TABS
5.0000 mg | ORAL_TABLET | Freq: Every day | ORAL | Status: DC
  Administered 2020-01-14: 5 mg via ORAL
  Filled 2020-01-14: qty 1

## 2020-01-14 MED ORDER — POTASSIUM PHOSPHATES 15 MMOLE/5ML IV SOLN
30.0000 mmol | Freq: Once | INTRAVENOUS | Status: AC
  Administered 2020-01-14: 30 mmol via INTRAVENOUS
  Filled 2020-01-14: qty 10

## 2020-01-14 MED ORDER — GLUCERNA SHAKE PO LIQD
237.0000 mL | Freq: Three times a day (TID) | ORAL | Status: DC
  Administered 2020-01-14 – 2020-01-16 (×4): 237 mL via ORAL
  Filled 2020-01-14 (×10): qty 237

## 2020-01-14 MED ORDER — LORAZEPAM 1 MG PO TABS: 1.0000 mg | ORAL_TABLET | Freq: Three times a day (TID) | ORAL | Status: DC | PRN

## 2020-01-14 MED ORDER — PANTOPRAZOLE SODIUM 40 MG IV SOLR
40.0000 mg | INTRAVENOUS | Status: DC
  Administered 2020-01-14 – 2020-01-18 (×5): 40 mg via INTRAVENOUS
  Filled 2020-01-14 (×4): qty 40

## 2020-01-14 MED ORDER — SODIUM CHLORIDE 0.9 % IV SOLN: INTRAVENOUS | Status: DC

## 2020-01-14 MED ORDER — SENNOSIDES-DOCUSATE SODIUM 8.6-50 MG PO TABS
1.0000 | ORAL_TABLET | Freq: Two times a day (BID) | ORAL | Status: DC
  Administered 2020-01-14 – 2020-01-18 (×9): 1 via ORAL
  Filled 2020-01-14 (×9): qty 1

## 2020-01-14 MED ORDER — INSULIN ASPART 100 UNIT/ML ~~LOC~~ SOLN
0.0000 [IU] | Freq: Three times a day (TID) | SUBCUTANEOUS | Status: DC
  Administered 2020-01-14 (×2): 3 [IU] via SUBCUTANEOUS
  Administered 2020-01-15: 2 [IU] via SUBCUTANEOUS
  Administered 2020-01-15: 3 [IU] via SUBCUTANEOUS
  Administered 2020-01-15: 8 [IU] via SUBCUTANEOUS
  Administered 2020-01-16: 3 [IU] via SUBCUTANEOUS
  Administered 2020-01-16: 5 [IU] via SUBCUTANEOUS
  Administered 2020-01-17: 2 [IU] via SUBCUTANEOUS
  Administered 2020-01-17: 3 [IU] via SUBCUTANEOUS
  Administered 2020-01-17: 5 [IU] via SUBCUTANEOUS
  Administered 2020-01-18: 3 [IU] via SUBCUTANEOUS
  Administered 2020-01-18: 2 [IU] via SUBCUTANEOUS
  Filled 2020-01-14: qty 0.15

## 2020-01-14 NOTE — Progress Notes (Signed)
Progress Note: General Surgery Service   Chief Complaint/Subjective: Complaining about gurney and bein in ED for > 24 h. Pain in abdomen improved. No nausea, tolerating ice chips  Objective: Vital signs in last 24 hours: Pulse Rate:  [59-83] 77 (08/22 0630) Resp:  [17-28] 28 (08/22 0630) BP: (115-149)/(43-97) 147/76 (08/22 0630) SpO2:  [87 %-100 %] 90 % (08/22 0630)    Intake/Output from previous day: 08/21 0701 - 08/22 0700 In: 3010.3 [I.V.:2477.7; IV Piggyback:487.6] Out: 710 [Urine:650; Drains:60] Intake/Output this shift: No intake/output data recorded.  Gen: diaphorectic  Resp: nonlabored  Card: RRR  Abd: soft, obesity, drain with small amount red clear fluid in bulb, nontender on exam  Lab Results: CBC  Recent Labs    01/12/20 1209 01/13/20 0300  WBC 18.6* 15.2*  HGB 14.5 12.3*  HCT 41.5 35.9*  PLT 417* 254   BMET Recent Labs    01/12/20 1209 01/13/20 0300  NA 134* 137  K 3.4* 3.5  CL 96* 101  CO2 22 23  GLUCOSE 200* 121*  BUN 36* 36*  CREATININE 1.22 1.16  CALCIUM 8.6* 7.9*   PT/INR Recent Labs    01/12/20 1456 01/13/20 0300  LABPROT 15.4* 15.9*  INR 1.3* 1.3*   ABG No results for input(s): PHART, HCO3 in the last 72 hours.  Invalid input(s): PCO2, PO2  Anti-infectives: Anti-infectives (From admission, onward)   Start     Dose/Rate Route Frequency Ordered Stop   01/12/20 2200  piperacillin-tazobactam (ZOSYN) IVPB 3.375 g        3.375 g 12.5 mL/hr over 240 Minutes Intravenous Every 8 hours 01/12/20 1459     01/12/20 1600  vancomycin (VANCOCIN) IVPB 1000 mg/200 mL premix  Status:  Discontinued        1,000 mg 200 mL/hr over 60 Minutes Intravenous Every 8 hours 01/12/20 1453 01/12/20 1543   01/12/20 1445  piperacillin-tazobactam (ZOSYN) IVPB 4.5 g  Status:  Discontinued        4.5 g 200 mL/hr over 30 Minutes Intravenous  Once 01/12/20 1434 01/12/20 1436   01/12/20 1445  piperacillin-tazobactam (ZOSYN) IVPB 3.375 g        3.375 g 100  mL/hr over 30 Minutes Intravenous  Once 01/12/20 1436 01/12/20 1618      Medications: Scheduled Meds: . feeding supplement (GLUCERNA SHAKE)  237 mL Oral TID BM  . insulin aspart  0-15 Units Subcutaneous Q4H  . lip balm  1 application Topical BID   Continuous Infusions: . dextrose 5 % and 0.9 % NaCl with KCl 40 mEq/L 125 mL/hr at 01/14/20 0400  . lactated ringers    . methocarbamol (ROBAXIN) IV    . ondansetron (ZOFRAN) IV    . piperacillin-tazobactam (ZOSYN)  IV 3.375 g (01/14/20 0654)   PRN Meds:.alum & mag hydroxide-simeth, diphenhydrAMINE, HYDROmorphone (DILAUDID) injection, lactated ringers, magic mouthwash, menthol-cetylpyridinium, methocarbamol (ROBAXIN) IV, metoprolol tartrate, ondansetron (ZOFRAN) IV **OR** ondansetron (ZOFRAN) IV, phenol, prochlorperazine  Assessment/Plan: 64 yo male with acute appendicitis with perforation, IR drain 8/21 -IV abx -clear liquids and glucerna -hopefully will get to bed today and can mobilize and rest better   LOS: 2 days   Mickeal Skinner, MD [facility]

## 2020-01-14 NOTE — Progress Notes (Signed)
Referring Physician(s): Gross,S  Supervising Physician: Jacqulynn Cadet  Patient Status:  [facility] - In-pt  Chief Complaint:  Abdominal pain, nausea, periappendiceal abscess  Subjective: Patient still in the ED; complaining of intermittent nausea and some right lower quadrant discomfort although improved since right lower quadrant drain placed yesterday.   Allergies: Codeine  Medications: Prior to Admission medications   Medication Sig Start Date End Date Taking? Authorizing Provider  acetaminophen (TYLENOL) 500 MG tablet Take 500 mg by mouth every 6 (six) hours as needed for mild pain or headache.   Yes [provider]  amLODipine (NORVASC) 5 MG tablet Take 5 mg by mouth daily.   Yes [provider]  aspirin EC 81 MG tablet Take 81 mg by mouth daily.   Yes [provider]  atorvastatin (LIPITOR) 40 MG tablet Take 40 mg by mouth daily. 11/24/19  Yes [provider]  glipiZIDE (GLUCOTROL) 10 MG tablet Take 10 mg by mouth daily before breakfast.   Yes [provider]  lisinopril-hydrochlorothiazide (PRINZIDE,ZESTORETIC) 20-12.5 MG tablet Take 1 tablet by mouth daily.   Yes [provider]  LORazepam (ATIVAN) 1 MG tablet Take 1 mg by mouth every 8 (eight) hours as needed for anxiety.  11/29/19  Yes [provider]  metFORMIN (GLUCOPHAGE-XR) 500 MG 24 hr tablet Take 500 mg by mouth 2 (two) times daily. 08/29/19  Yes [provider]  methocarbamol (ROBAXIN) 500 MG tablet Take 500 mg by mouth every 6 (six) hours as needed for muscle spasms.  11/29/19  Yes [provider]     Vital Signs: BP (!) 142/74    Pulse 69    Temp 99.6 F (37.6 C) (Oral)    Resp (!) 27    Ht 6\' 2"  (1.88 m)    Wt 280 lb (127 kg)    SpO2 91%    BMI 35.95 kg/m   Physical Exam awake, alert.  Right lower quadrant drain intact, insertion site okay, mildly tender to palpation, output 60 cc blood-tinged fluid; drain flushed without  difficulty  Imaging: CT Abdomen Pelvis Wo Contrast  Result Date: 01/12/2020 CLINICAL DATA:  Severe abdominal pain and distention with nausea and vomiting EXAM: CT ABDOMEN AND PELVIS WITHOUT CONTRAST TECHNIQUE: Multidetector CT imaging of the abdomen and pelvis was performed following the standard protocol without IV contrast. COMPARISON:  None. FINDINGS: Technical note: Examination is degraded by patient motion artifact. Lower chest: 6 mm triangular right lower lobe subpleural nodule (series 2, image 9). 3 mm right lower lobe nodule (series 2, image 41). Lung bases are otherwise clear. Heart size is normal. Coronary artery calcification is present. Hepatobiliary: Mildly decreased attenuation of the hepatic parenchyma suggesting hepatic steatosis. No focal liver lesion is identified on noncontrast study. Small layering hyperdensity within the gallbladder compatible with cholelithiasis. No pericholecystic inflammatory changes. No biliary dilatation. Pancreas: Unremarkable. No pancreatic ductal dilatation or surrounding inflammatory changes. Spleen: Normal in size without focal abnormality. Adrenals/Urinary Tract: Adrenal glands are unremarkable. Kidneys are normal, without renal calculi, focal lesion, or hydronephrosis. Bladder is unremarkable for the degree of distension. Stomach/Bowel: Thick walled fluid collection adjacent to the base of the cecum along the posterior margin measuring approximately 8.0 x 5.0 x 5.0 cm (series 3, image 67; series 6, image 70) with surrounding fat stranding. Small focus of air is present within the collection. Appendix is not definitively visualized. Remaining colon is within normal limits. Stomach and small bowel are unremarkable. No dilated loops of bowel to suggest obstruction.  Vascular/Lymphatic: Scattered aortoiliac atherosclerotic calcifications without aneurysm. No abdominopelvic lymphadenopathy. Reproductive: Prostate is unremarkable. Other: No pneumoperitoneum.  No  abdominal wall hernia. Musculoskeletal: Multilevel thoracolumbar spondylosis. No acute osseous findings. IMPRESSION: 1. Thick-walled fluid collection adjacent to the base of the cecum measuring up to 8.0 cm with surrounding fat stranding compatible with abscess. Appendix is not definitively visualized. Findings may be secondary to a focal colitis or appendicitis. A perforated colonic malignancy is not excluded. 2. Cholelithiasis without evidence of acute cholecystitis. 3. Hepatic steatosis. 4. Two small pulmonary nodules measuring up to 6 mm. Non-contrast chest CT at 3-6 months is recommended. If the nodules are stable at time of repeat CT, then future CT at 18-24 months (from today's scan) is considered optional for low-risk patients, but is recommended for high-risk patients. This recommendation follows the consensus statement: Guidelines for Management of Incidental Pulmonary Nodules Detected on CT Images: From the Fleischner Society 2017; Radiology 2017; 284:228-243. Aortic Atherosclerosis (ICD10-I70.0). These results were called by telephone at the time of interpretation on 01/12/2020 at 2:31 pm to provider Donney Rankins, [place], who verbally acknowledged these results. Electronically Signed   By: Davina Poke D.O.   On: 01/12/2020 14:31   CT IMAGE GUIDED DRAINAGE BY PERCUTANEOUS CATHETER  Result Date: 01/14/2020 INDICATION: 64 year old male with right lower quadrant intra-abdominal abscess favored to represent perforated appendicitis. He presents for percutaneous drain placement. EXAM: CT abscess drain placement MEDICATIONS: The patient is currently admitted to the hospital and receiving intravenous antibiotics. The antibiotics were administered within an appropriate time frame prior to the initiation of the procedure. ANESTHESIA/SEDATION: Fentanyl 25 mcg IV; Versed 0.5 mg IV Moderate Sedation Time:  12 minutes The patient was continuously monitored during the procedure by the interventional radiology nurse under  my direct supervision. COMPLICATIONS: None immediate. PROCEDURE: Informed written consent was obtained from the patient after a thorough discussion of the procedural risks, benefits and alternatives. All questions were addressed. Maximal Sterile Barrier Technique was utilized including caps, mask, sterile gowns, sterile gloves, sterile drape, hand hygiene and skin antiseptic. A timeout was performed prior to the initiation of the procedure. A planning axial CT scan was performed with the patient in the supine window. There is a suboptimal window into the abscess cavity. Therefore, the patient was manipulated into the left lateral decubitus position. Repeat CT imaging was performed identifying a safe access into the right lower quadrant abscess. The skin was sterilely prepped and draped in the standard fashion using chlorhexidine skin prep. Local anesthesia was attained by infiltration with 1% lidocaine. A small dermatotomy was made. Under intermittent CT guidance, an 18 gauge trocar needle was advanced into the fluid collection. A 0.035 wire was coiled in the fluid collection. The skin tract was then dilated to 12 [place]. A 12 French all-purpose drainage catheter was advanced over the wire and formed in the fluid collection. Aspiration yields approximately 60 mL foul-smelling purulent fluid. Samples were sent for Gram stain and culture. The drainage catheter was flushed and connected to JP bulb suction before being secured to the skin with 0 Prolene suture. Post placement CT imaging demonstrates a well-positioned drainage catheter and no evidence of immediate complication. IMPRESSION: Successful placement of a 12 French drainage catheter into the right lower quadrant abscess. Electronically Signed   By: Jacqulynn Cadet M.D.   On: 01/14/2020 08:47    Labs:  CBC: Recent Labs    01/12/20 1209 01/13/20 0300 01/14/20 0500  WBC 18.6* 15.2* 11.7*  HGB 14.5 12.3* 13.4  HCT 41.5  35.9* 39.2  PLT 417* 254 228     COAGS: Recent Labs    01/12/20 1456 01/13/20 0300  INR 1.3* 1.3*    BMP: Recent Labs    01/12/20 1209 01/13/20 0300 01/14/20 0500  NA 134* 137 137  K 3.4* 3.5 4.2  CL 96* 101 105  CO2 22 23 24   GLUCOSE 200* 121* 207*  BUN 36* 36* 31*  CALCIUM 8.6* 7.9* 7.7*  CREATININE 1.22 1.16 0.85  GFRNONAA >60 >60 >60  GFRAA >60 >60 >60    LIVER FUNCTION TESTS: Recent Labs    01/12/20 1209 01/13/20 0300 01/14/20 0500  BILITOT 2.0* 2.4* 1.8*  AST 42* 49* 57*  ALT 51* 46* 51*  ALKPHOS 90 79 111  PROT 7.0 5.8* 5.9*  ALBUMIN 3.4* 2.6* 2.6*    Assessment and Plan: Patient with history of right lower quadrant pain/ perforated appendicitis with associated abscess; s/p drain placement on 8/21; WBC 11.7 down from 15.2, hemoglobin normal,creat 0.85, LA <0.3; drain fluid/blood cx pend; check fluid cytology; continue drain irrigation/output monitoring; once output less than 10 to 15 cc/day for 2-3 consecutive days or if clinical status worsens obtain follow-up CT; additional plans as per TRH and CCS.   Electronically Signed: D. Rowe Robert, PA-C 01/14/2020, 10:15 AM   I spent a total of 15 minutes at the the patient's bedside AND on the patient's hospital floor or unit, greater than 50% of which was counseling/coordinating care for right lower quadrant abdominal abscess drain    Patient ID: Albert Mayer, male   DOB: Mar 11, 1956, 64 y.o.   MRN: [id number]

## 2020-01-14 NOTE — Progress Notes (Signed)
PROGRESS NOTE    Albert Mayer  AXK:[id number] DOB: 02-Sep-1955 DOA: 01/12/2020 PCP: Christain Sacramento, MD    Chief Complaint  Patient presents with  . Abdominal Pain    Brief Narrative:  Patient 64 year old gentleman history of non-insulin-dependent diabetes mellitus type 2, hypertension, hyperlipidemia, chronic back pain, obesity presenting with a 2-week history of worsening abdominal pain, fever, chills, fatigue, nausea, vomiting.  Work-up in the ED CT abdomen and pelvis concerning for acute perforated appendicitis with abscess formation.  Patient placed empirically on IV Zosyn.  General surgery consulted who recommended IR for drain placement.   Assessment & Plan:   Principal Problem:   Acute appendicitis with perforation and peritoneal abscess Active Problems:   Degeneration of lumbar intervertebral disc   Type 2 diabetes mellitus (HCC)   Primary hypertension   Hyperglycemia   Severe obesity (BMI 35.0-35.9 with comorbidity) (HCC)   Hypokalemia   Sepsis ([place])   Severe sepsis with acute organ dysfunction (HCC)  1 severe sepsis secondary to intra-abdominal perforated appendicitis with abscess formation Patient presented with a 2-week history of worsening abdominal pain.  CT abdomen and pelvis done with intra-abdominal abscesses concerning for perforated appendicitis.  Patient met criteria for sepsis with tachycardia, fever, leukocytosis, evidence of endorgan damage by elevated lactic acidosis of 5.1, transaminitis, elevated procalcitonin level.  Patient fever curve trended down.  Currently afebrile x24 hours.  Leukocytosis was trending down however labs pending for this morning.  Patient pancultured results pending.  Status post drain placement right lower quadrant abscess per IR 01/13/2020 with cultures sent.  Patient with some clinical improvement.  Patient started on clear liquids per general surgery.  Continue IV Zosyn.  General surgery and IR following.   2.  Non-insulin-dependent  diabetes mellitus type 2 with hyperglycemia Hemoglobin A1c 8.1 (01/12/2020).  CBG of 147 this morning.  Patient noted to have a CBG of 55 yesterday morning however was n.p.o.  Patient currently on D5 normal saline with KCl and will decrease rate to 100 cc/h.  Patient placed on clear liquids per general surgery this morning.  Continue to hold home oral hypoglycemic agents.  Change CBGs to [facility] and at bedtime.  Sliding scale insulin.    3.  Hypokalemia Likely secondary to GI losses.  Patient was n.p.o. and started on clears this morning.  Potassium supplemented through IV.  Labs pending for this morning.    4.  Mild hyponatremia Likely secondary to hypovolemic hyponatremia secondary to problem #1 in the setting of HCTZ.  HCTZ discontinued.  Sodium levels improved with hydration.  Labs pending for this morning.  Follow.    5.  Elevated liver enzymes/transaminitis Likely secondary to problem #1.  Labs pending for this morning.  Follow.  6.  Leukocytosis Secondary to problem #1.  Patient pancultured.  Status post drain placement in right lower quadrant abscess with cultures sent and pending.  Continue IV Zosyn.  7.  Hypertension Decrease IV fluid rate to 100 cc/h.  Resume home regimen Norvasc.  Follow.    8.  Chronic back pain Continue current pain regimen.   9.  Pulmonary nodules Incidental finding.  Will need repeat CT in 3 to 6 months.  10.  Morbid obesity Outpatient follow-up.   DVT prophylaxis: SCDs Code Status: Full Family Communication: Updated patient.  No family at bedside. Disposition:   Status is: Inpatient    Dispo: The patient is from: Home              Anticipated d/c is  to: Home              Anticipated d/c date is: To be determined.              Patient currently receiving IV antibiotics, admitted for perforated appendicitis with abscess.  Status post drain placement.  Started on clears.  General surgery following.  Not medically stable for discharge.          Consultants:   General surgery: Dr. Johney Maine 01/12/2020  Interventional radiology: Dr. Jacqulynn Cadet 01/12/2020  Procedures:   CT abdomen and pelvis 01/12/2020  CT-guided placement of 12 French gauge drainage catheter into right lower quadrant abscess per Dr. Jacqulynn Cadet, IR 01/13/2020  Antimicrobials:   IV Zosyn 01/12/2020>>>   Subjective: Patient laying on gurney.,  Frustrated still in the ED.  States some improvement with diffuse abdominal pain.  Complaining of abdominal discomfort around drain site.  Denies chest pain.  No shortness of breath.  Passing flatus.  Belching.  States overall feeling better than he did on admission.   Objective: Vitals:   01/14/20 0528 01/14/20 0600 01/14/20 0630 01/14/20 0851  BP: (!) 142/57 (!) 142/75 (!) 147/76 (!) 161/74  Pulse: 80 73 77 73  Resp: 17 (!) 21 (!) 28 19  Temp:      TempSrc:      SpO2: 94% (!) 87% 90% 92%  Weight:      Height:        Intake/Output Summary (Last 24 hours) at 01/14/2020 0901 Last data filed at 01/14/2020 0653 Gross per 24 hour  Intake 3010.31 ml  Output 710 ml  Net 2300.31 ml   Filed Weights   01/12/20 1421  Weight: 127 kg    Examination:  General exam: NAD Respiratory system: CTA B anterior lung fields.  No wheezes, no crackles, no rhonchi.  Normal respiratory effort.  Cardiovascular system: Regular rate rhythm no murmurs rubs or gallops.  No JVD.  No lower extremity edema.  Gastrointestinal system: Abdomen is obese, distended, soft, decreased diffuse tenderness to palpation, positive bowel sounds.  No rebound.  No guarding.  Drain noted in right flank with sanguinous fluid in bulb. Central nervous system: Alert and oriented. No focal neurological deficits. Extremities: Symmetric 5 x 5 power. Skin: No rashes, lesions or ulcers Psychiatry: Judgement and insight appear normal. Mood & affect appropriate.     Data Reviewed: I have personally reviewed following labs and imaging  studies  CBC: Recent Labs  Lab 01/12/20 1209 01/13/20 0300  WBC 18.6* 15.2*  HGB 14.5 12.3*  HCT 41.5 35.9*  MCV 87.0 89.1  PLT 417* 175    Basic Metabolic Panel: Recent Labs  Lab 01/12/20 1209 01/13/20 0300  NA 134* 137  K 3.4* 3.5  CL 96* 101  CO2 22 23  GLUCOSE 200* 121*  BUN 36* 36*  CREATININE 1.22 1.16  CALCIUM 8.6* 7.9*    GFR: Estimated Creatinine Clearance: 89.9 mL/min (by C-G formula based on SCr of 1.16 mg/dL).  Liver Function Tests: Recent Labs  Lab 01/12/20 1209 01/13/20 0300  AST 42* 49*  ALT 51* 46*  ALKPHOS 90 79  BILITOT 2.0* 2.4*  PROT 7.0 5.8*  ALBUMIN 3.4* 2.6*    CBG: Recent Labs  Lab 01/13/20 1708 01/13/20 1953 01/14/20 0133 01/14/20 0453 01/14/20 0754  GLUCAP 111* 88 147* 192* 175*     Recent Results (from the past 240 hour(s))  Blood culture (routine x 2)     Status: None (Preliminary result)  Collection Time: 01/12/20  2:33 PM   Specimen: BLOOD  Result Value Ref Range Status   Specimen Description   Final    BLOOD RIGHT ANTECUBITAL Performed at [facility], [place] [address], [place], [place] [address]    Special Requests   Final    BOTTLES DRAWN AEROBIC AND ANAEROBIC Blood Culture results may not be optimal due to an excessive volume of blood received in culture bottles Performed at [facility] [address], [place], [place] [address]    Culture   Final    NO GROWTH < 24 HOURS Performed at [facility] [address], [place], [place] [address]    Report Status PENDING  Incomplete  Blood culture (routine x 2)     Status: None (Preliminary result)   Collection Time: 01/12/20  2:38 PM   Specimen: BLOOD  Result Value Ref Range Status   Specimen Description   Final    BLOOD LEFT WRIST Performed at [facility] [address] [place], [place] [address]    Special Requests   Final    BOTTLES DRAWN AEROBIC AND ANAEROBIC Blood Culture adequate volume Performed  at [facility] [address], [place], [place] [address]    Culture   Final    NO GROWTH < 24 HOURS Performed at [facility] [address] [place], [place] [address]    Report Status PENDING  Incomplete  SARS Coronavirus 2 by RT PCR (hospital order, performed in [facility] hospital lab) Nasopharyngeal Nasopharyngeal Swab     Status: None   Collection Time: 01/13/20  9:04 AM   Specimen: Nasopharyngeal Swab  Result Value Ref Range Status   SARS Coronavirus 2 NEGATIVE NEGATIVE Final    Comment: (NOTE) SARS-CoV-2 target nucleic acids are NOT DETECTED.  The SARS-CoV-2 RNA is generally detectable in upper and lower respiratory specimens during the acute phase of infection. The lowest concentration of SARS-CoV-2 viral copies this assay can detect is 250 copies / mL. A negative result does not preclude SARS-CoV-2 infection and should not be used as the sole basis for treatment or other patient management decisions.  A negative result may occur with improper specimen collection / handling, submission of specimen other than nasopharyngeal swab, presence of viral mutation(s) within the areas targeted by this assay, and inadequate number of viral copies (<250 copies / mL). A negative result must be combined with clinical observations, patient history, and epidemiological information.  Fact Sheet for Patients:   StrictlyIdeas.no  Fact Sheet for Healthcare Providers: BankingDealers.co.za  This test is not yet approved or  cleared by the [place] FDA and has been authorized for detection and/or diagnosis of SARS-CoV-2 by FDA under an Emergency Use Authorization (EUA).  This EUA will remain in effect (meaning this test can be used) for the duration of the COVID-19 declaration under Section 564(b)(1) of the Act, 21 U.S.C. section 360bbb-3(b)(1), unless the authorization is terminated or revoked  sooner.  Performed at [facility], [place] [address] [place], [place] [address]   Aerobic/Anaerobic Culture (surgical/deep wound)     Status: None (Preliminary result)   Collection Time: 01/13/20  3:55 PM   Specimen: Abscess  Result Value Ref Range Status   Specimen Description   Final    ABSCESS Performed at [facility] [address], [place],  [address]    Special Requests   Final    NONE Performed at  [facility], [place] [address] [place], [place] [address]    Gram Stain   Final    NO WBC SEEN RARE GRAM NEGATIVE RODS Performed at [facility], [place] [address], [place], [place] [address]    Culture PENDING  Incomplete   Report Status PENDING  Incomplete         Radiology Studies: CT Abdomen Pelvis Wo Contrast  Result Date: 01/12/2020 CLINICAL DATA:  Severe abdominal pain and distention with nausea and vomiting EXAM: CT ABDOMEN AND PELVIS WITHOUT CONTRAST TECHNIQUE: Multidetector CT imaging of the abdomen and pelvis was performed following the standard protocol without IV contrast. COMPARISON:  None. FINDINGS: Technical note: Examination is degraded by patient motion artifact. Lower chest: 6 mm triangular right lower lobe subpleural nodule (series 2, image 9). 3 mm right lower lobe nodule (series 2, image 41). Lung bases are otherwise clear. Heart size is normal. Coronary artery calcification is present. Hepatobiliary: Mildly decreased attenuation of the hepatic parenchyma suggesting hepatic steatosis. No focal liver lesion is identified on noncontrast study. Small layering hyperdensity within the gallbladder compatible with cholelithiasis. No pericholecystic inflammatory changes. No biliary dilatation. Pancreas: Unremarkable. No pancreatic ductal dilatation or surrounding inflammatory changes. Spleen: Normal in size without focal abnormality. Adrenals/Urinary Tract: Adrenal glands are unremarkable. Kidneys are  normal, without renal calculi, focal lesion, or hydronephrosis. Bladder is unremarkable for the degree of distension. Stomach/Bowel: Thick walled fluid collection adjacent to the base of the cecum along the posterior margin measuring approximately 8.0 x 5.0 x 5.0 cm (series 3, image 67; series 6, image 70) with surrounding fat stranding. Small focus of air is present within the collection. Appendix is not definitively visualized. Remaining colon is within normal limits. Stomach and small bowel are unremarkable. No dilated loops of bowel to suggest obstruction. Vascular/Lymphatic: Scattered aortoiliac atherosclerotic calcifications without aneurysm. No abdominopelvic lymphadenopathy. Reproductive: Prostate is unremarkable. Other: No pneumoperitoneum.  No abdominal wall hernia. Musculoskeletal: Multilevel thoracolumbar spondylosis. No acute osseous findings. IMPRESSION: 1. Thick-walled fluid collection adjacent to the base of the cecum measuring up to 8.0 cm with surrounding fat stranding compatible with abscess. Appendix is not definitively visualized. Findings may be secondary to a focal colitis or appendicitis. A perforated colonic malignancy is not excluded. 2. Cholelithiasis without evidence of acute cholecystitis. 3. Hepatic steatosis. 4. Two small pulmonary nodules measuring up to 6 mm. Non-contrast chest CT at 3-6 months is recommended. If the nodules are stable at time of repeat CT, then future CT at 18-24 months (from today's scan) is considered optional for low-risk patients, but is recommended for high-risk patients. This recommendation follows the consensus statement: Guidelines for Management of Incidental Pulmonary Nodules Detected on CT Images: From the Fleischner Society 2017; Radiology 2017; 284:228-243. Aortic Atherosclerosis (ICD10-I70.0). These results were called by telephone at the time of interpretation on 01/12/2020 at 2:31 pm to provider Donney Rankins, [place], who verbally acknowledged these results.  Electronically Signed   By: Davina Poke D.O.   On: 01/12/2020 14:31   CT IMAGE GUIDED DRAINAGE BY PERCUTANEOUS CATHETER  Result Date: 01/14/2020 INDICATION: 64 year old male with right lower quadrant intra-abdominal abscess favored to represent perforated appendicitis. He presents for percutaneous drain placement. EXAM: CT abscess drain placement MEDICATIONS: The patient is currently admitted to the hospital and receiving intravenous antibiotics. The antibiotics were administered within an appropriate time frame prior to the initiation of the procedure. ANESTHESIA/SEDATION: Fentanyl 25 mcg IV; Versed 0.5 mg IV Moderate Sedation Time:  12 minutes The patient was continuously  monitored during the procedure by the interventional radiology nurse under my direct supervision. COMPLICATIONS: None immediate. PROCEDURE: Informed written consent was obtained from the patient after a thorough discussion of the procedural risks, benefits and alternatives. All questions were addressed. Maximal Sterile Barrier Technique was utilized including caps, mask, sterile gowns, sterile gloves, sterile drape, hand hygiene and skin antiseptic. A timeout was performed prior to the initiation of the procedure. A planning axial CT scan was performed with the patient in the supine window. There is a suboptimal window into the abscess cavity. Therefore, the patient was manipulated into the left lateral decubitus position. Repeat CT imaging was performed identifying a safe access into the right lower quadrant abscess. The skin was sterilely prepped and draped in the standard fashion using chlorhexidine skin prep. Local anesthesia was attained by infiltration with 1% lidocaine. A small dermatotomy was made. Under intermittent CT guidance, an 18 gauge trocar needle was advanced into the fluid collection. A 0.035 wire was coiled in the fluid collection. The skin tract was then dilated to 12 [place]. A 12 French all-purpose drainage catheter  was advanced over the wire and formed in the fluid collection. Aspiration yields approximately 60 mL foul-smelling purulent fluid. Samples were sent for Gram stain and culture. The drainage catheter was flushed and connected to JP bulb suction before being secured to the skin with 0 Prolene suture. Post placement CT imaging demonstrates a well-positioned drainage catheter and no evidence of immediate complication. IMPRESSION: Successful placement of a 12 French drainage catheter into the right lower quadrant abscess. Electronically Signed   By: Jacqulynn Cadet M.D.   On: 01/14/2020 08:47        Scheduled Meds: . amLODipine  5 mg Oral Daily  . feeding supplement (GLUCERNA SHAKE)  237 mL Oral TID BM  . insulin aspart  0-15 Units Subcutaneous Q4H  . lip balm  1 application Topical BID  . senna-docusate  1 tablet Oral BID   Continuous Infusions: . dextrose 5 % and 0.9 % NaCl with KCl 40 mEq/L 100 mL/hr at 01/14/20 0844  . lactated ringers    . methocarbamol (ROBAXIN) IV    . ondansetron (ZOFRAN) IV    . piperacillin-tazobactam (ZOSYN)  IV 3.375 g (01/14/20 0654)     LOS: 2 days    Time spent: 40 minutes    Irine Seal, MD [facility]   To contact the attending provider between 7A-7P or the covering provider during after hours 7P-7A, please log into the web site [email or web address] and access using universal [place] password for that web site. If you do not have the password, please call the hospital operator.  01/14/2020, 9:01 AM

## 2020-01-15 DIAGNOSIS — R5381 Other malaise: Secondary | ICD-10-CM

## 2020-01-15 LAB — MAGNESIUM: Magnesium: 1.9 mg/dL (ref 1.7–2.4)

## 2020-01-15 LAB — COMPREHENSIVE METABOLIC PANEL
ALT: 49 U/L — ABNORMAL HIGH (ref 0–44)
AST: 44 U/L — ABNORMAL HIGH (ref 15–41)
Albumin: 2.4 g/dL — ABNORMAL LOW (ref 3.5–5.0)
Alkaline Phosphatase: 104 U/L (ref 38–126)
Anion gap: 10 (ref 5–15)
BUN: 23 mg/dL (ref 8–23)
CO2: 25 mmol/L (ref 22–32)
Calcium: 8 mg/dL — ABNORMAL LOW (ref 8.9–10.3)
Chloride: 105 mmol/L (ref 98–111)
Creatinine, Ser: 0.8 mg/dL (ref 0.61–1.24)
GFR calc Af Amer: >60 mL/min (ref >60)
GFR calc non Af Amer: >60 mL/min (ref >60)
Glucose, Bld: 162 mg/dL — ABNORMAL HIGH (ref 70–99)
Potassium: 3.9 mmol/L (ref 3.5–5.1)
Sodium: 140 mmol/L (ref 135–145)
Total Bilirubin: 1.6 mg/dL — ABNORMAL HIGH (ref 0.3–1.2)
Total Protein: 5.7 g/dL — ABNORMAL LOW (ref 6.5–8.1)

## 2020-01-15 LAB — CBC WITH DIFFERENTIAL/PLATELET
Abs Immature Granulocytes: 0.24 10*3/uL — ABNORMAL HIGH (ref 0.00–0.07)
Basophils Absolute: 0 10*3/uL (ref 0.0–0.1)
Basophils Relative: 0 %
Eosinophils Absolute: 0.2 10*3/uL (ref 0.0–0.5)
Eosinophils Relative: 2 %
HCT: 35.7 % — ABNORMAL LOW (ref 39.0–52.0)
Hemoglobin: 12.3 g/dL — ABNORMAL LOW (ref 13.0–17.0)
Immature Granulocytes: 2 %
Lymphocytes Relative: 10 %
Lymphs Abs: 1.1 10*3/uL (ref 0.7–4.0)
MCH: 31.1 pg (ref 26.0–34.0)
MCHC: 34.5 g/dL (ref 30.0–36.0)
MCV: 90.2 fL (ref 80.0–100.0)
Monocytes Absolute: 1.2 10*3/uL — ABNORMAL HIGH (ref 0.1–1.0)
Monocytes Relative: 11 %
Neutro Abs: 8.3 10*3/uL — ABNORMAL HIGH (ref 1.7–7.7)
Neutrophils Relative %: 75 %
Platelets: 237 10*3/uL (ref 150–400)
RBC: 3.96 MIL/uL — ABNORMAL LOW (ref 4.22–5.81)
RDW: 13.2 % (ref 11.5–15.5)
WBC: 11.1 10*3/uL — ABNORMAL HIGH (ref 4.0–10.5)
nRBC: 0 % (ref 0.0–0.2)

## 2020-01-15 LAB — CBG MONITORING, ED
Glucose-Capillary: 141 mg/dL — ABNORMAL HIGH (ref 70–99)
Glucose-Capillary: 189 mg/dL — ABNORMAL HIGH (ref 70–99)

## 2020-01-15 LAB — GLUCOSE, CAPILLARY
Glucose-Capillary: 258 mg/dL — ABNORMAL HIGH (ref 70–99)
Glucose-Capillary: 145 mg/dL — ABNORMAL HIGH (ref 70–99)

## 2020-01-15 LAB — PROCALCITONIN: Procalcitonin: 19.99 ng/mL

## 2020-01-15 LAB — PHOSPHORUS: Phosphorus: 2.6 mg/dL (ref 2.5–4.6)

## 2020-01-15 LAB — LACTIC ACID, PLASMA: Lactic Acid, Venous: 1.3 mmol/L (ref 0.5–1.9)

## 2020-01-15 MED ORDER — MAGNESIUM SULFATE 2 GM/50ML IV SOLN
2.0000 g | Freq: Once | INTRAVENOUS | Status: AC
  Administered 2020-01-15: 2 g via INTRAVENOUS
  Filled 2020-01-15: qty 50

## 2020-01-15 MED ORDER — HYDROMORPHONE HCL 1 MG/ML IJ SOLN: 0.5000 mg | INTRAMUSCULAR | Status: DC | PRN

## 2020-01-15 MED ORDER — ACETAMINOPHEN 500 MG PO TABS
1000.0000 mg | ORAL_TABLET | Freq: Three times a day (TID) | ORAL | Status: DC
  Administered 2020-01-15 – 2020-01-18 (×10): 1000 mg via ORAL
  Filled 2020-01-15 (×11): qty 2

## 2020-01-15 MED ORDER — AMLODIPINE BESYLATE 10 MG PO TABS
10.0000 mg | ORAL_TABLET | Freq: Every day | ORAL | Status: DC
  Administered 2020-01-15 – 2020-01-18 (×4): 10 mg via ORAL
  Filled 2020-01-15 (×3): qty 1
  Filled 2020-01-15: qty 2

## 2020-01-15 MED ORDER — OXYCODONE HCL 5 MG PO TABS
5.0000 mg | ORAL_TABLET | ORAL | Status: DC | PRN
  Administered 2020-01-16: 5 mg via ORAL
  Filled 2020-01-15: qty 1

## 2020-01-15 NOTE — ED Notes (Signed)
Breakfast tray delivered

## 2020-01-15 NOTE — ED Notes (Signed)
Lunch tray delivered.

## 2020-01-15 NOTE — Progress Notes (Signed)
Referring Physician(s): Dr. Clyda Greener  Supervising Physician: Sandi Mariscal  Patient Status:  [facility] - In-pt  Chief Complaint:  Perforated appendix s/p abscess drain placement on 8.21.21  Subjective: 64 y.o. male inpatient. History of DM, HTN, HLD, chronic back pain. Presented to the ED with abdominal pain X 2 weeks found to have a perforated appendix. IR placed an abscess drain on 8.21.21. Patient alert and sitting in recliner, calm and comfortable. Wife at bedside. Denies any fevers, headache, chest pain, SOB, cough, nausea, vomiting. Patient endorses persistent but improving abdominal pain.    Allergies: Codeine  Medications: Prior to Admission medications   Medication Sig Start Date End Date Taking? Authorizing Provider  acetaminophen (TYLENOL) 500 MG tablet Take 500 mg by mouth every 6 (six) hours as needed for mild pain or headache.   Yes [provider]  amLODipine (NORVASC) 5 MG tablet Take 5 mg by mouth daily.   Yes [provider]  aspirin EC 81 MG tablet Take 81 mg by mouth daily.   Yes [provider]  atorvastatin (LIPITOR) 40 MG tablet Take 40 mg by mouth daily. 11/24/19  Yes [provider]  glipiZIDE (GLUCOTROL) 10 MG tablet Take 10 mg by mouth daily before breakfast.   Yes [provider]  lisinopril-hydrochlorothiazide (PRINZIDE,ZESTORETIC) 20-12.5 MG tablet Take 1 tablet by mouth daily.   Yes [provider]  LORazepam (ATIVAN) 1 MG tablet Take 1 mg by mouth every 8 (eight) hours as needed for anxiety.  11/29/19  Yes [provider]  metFORMIN (GLUCOPHAGE-XR) 500 MG 24 hr tablet Take 500 mg by mouth 2 (two) times daily. 08/29/19  Yes [provider]  methocarbamol (ROBAXIN) 500 MG tablet Take 500 mg by mouth every 6 (six) hours as needed for muscle spasms.  11/29/19  Yes [provider]     Vital Signs: BP (!) 163/79 (BP Location: Right Arm)   Pulse 65   Temp 98.4 F (36.9 C) (Oral)   Resp  16   Ht 6\' 2"  (1.88 m)   Wt 280 lb (127 kg)   SpO2 97%   BMI 35.95 kg/m   Physical Exam Vitals and nursing note reviewed.  Constitutional:      Appearance: He is well-developed.  HENT:     Head: Normocephalic.  Pulmonary:     Effort: Pulmonary effort is normal.  Abdominal:     Comments: Positive RUQ drain  to suction. . site is unremarkable with no erythema, edema, tenderness, bleeding or drainage noted at exit site. Suture and stat lock in place. Dressing is clean dry and intact. 15  ml of  serosangious colored fluid noted in bulb suction device. Drain is able to be flushed easily.  .    Musculoskeletal:        General: Normal range of motion.     Cervical back: Normal range of motion.  Skin:    General: Skin is dry.  Neurological:     Mental Status: He is alert and oriented to person, place, and time.     Imaging: CT Abdomen Pelvis Wo Contrast  Result Date: 01/12/2020 CLINICAL DATA:  Severe abdominal pain and distention with nausea and vomiting EXAM: CT ABDOMEN AND PELVIS WITHOUT CONTRAST TECHNIQUE: Multidetector CT imaging of the abdomen and pelvis was performed following the standard protocol without IV contrast. COMPARISON:  None. FINDINGS: Technical note: Examination is degraded by patient motion artifact. Lower chest: 6 mm triangular right lower lobe subpleural nodule (series 2, image  9). 3 mm right lower lobe nodule (series 2, image 41). Lung bases are otherwise clear. Heart size is normal. Coronary artery calcification is present. Hepatobiliary: Mildly decreased attenuation of the hepatic parenchyma suggesting hepatic steatosis. No focal liver lesion is identified on noncontrast study. Small layering hyperdensity within the gallbladder compatible with cholelithiasis. No pericholecystic inflammatory changes. No biliary dilatation. Pancreas: Unremarkable. No pancreatic ductal dilatation or surrounding inflammatory changes. Spleen: Normal in size without focal abnormality.  Adrenals/Urinary Tract: Adrenal glands are unremarkable. Kidneys are normal, without renal calculi, focal lesion, or hydronephrosis. Bladder is unremarkable for the degree of distension. Stomach/Bowel: Thick walled fluid collection adjacent to the base of the cecum along the posterior margin measuring approximately 8.0 x 5.0 x 5.0 cm (series 3, image 67; series 6, image 70) with surrounding fat stranding. Small focus of air is present within the collection. Appendix is not definitively visualized. Remaining colon is within normal limits. Stomach and small bowel are unremarkable. No dilated loops of bowel to suggest obstruction. Vascular/Lymphatic: Scattered aortoiliac atherosclerotic calcifications without aneurysm. No abdominopelvic lymphadenopathy. Reproductive: Prostate is unremarkable. Other: No pneumoperitoneum.  No abdominal wall hernia. Musculoskeletal: Multilevel thoracolumbar spondylosis. No acute osseous findings. IMPRESSION: 1. Thick-walled fluid collection adjacent to the base of the cecum measuring up to 8.0 cm with surrounding fat stranding compatible with abscess. Appendix is not definitively visualized. Findings may be secondary to a focal colitis or appendicitis. A perforated colonic malignancy is not excluded. 2. Cholelithiasis without evidence of acute cholecystitis. 3. Hepatic steatosis. 4. Two small pulmonary nodules measuring up to 6 mm. Non-contrast chest CT at 3-6 months is recommended. If the nodules are stable at time of repeat CT, then future CT at 18-24 months (from today's scan) is considered optional for low-risk patients, but is recommended for high-risk patients. This recommendation follows the consensus statement: Guidelines for Management of Incidental Pulmonary Nodules Detected on CT Images: From the Fleischner Society 2017; Radiology 2017; 284:228-243. Aortic Atherosclerosis (ICD10-I70.0). These results were called by telephone at the time of interpretation on 01/12/2020 at 2:31 pm  to provider Donney Rankins, [place], who verbally acknowledged these results. Electronically Signed   By: Davina Poke D.O.   On: 01/12/2020 14:31   CT IMAGE GUIDED DRAINAGE BY PERCUTANEOUS CATHETER  Result Date: 01/14/2020 INDICATION: 64 year old male with right lower quadrant intra-abdominal abscess favored to represent perforated appendicitis. He presents for percutaneous drain placement. EXAM: CT abscess drain placement MEDICATIONS: The patient is currently admitted to the hospital and receiving intravenous antibiotics. The antibiotics were administered within an appropriate time frame prior to the initiation of the procedure. ANESTHESIA/SEDATION: Fentanyl 25 mcg IV; Versed 0.5 mg IV Moderate Sedation Time:  12 minutes The patient was continuously monitored during the procedure by the interventional radiology nurse under my direct supervision. COMPLICATIONS: None immediate. PROCEDURE: Informed written consent was obtained from the patient after a thorough discussion of the procedural risks, benefits and alternatives. All questions were addressed. Maximal Sterile Barrier Technique was utilized including caps, mask, sterile gowns, sterile gloves, sterile drape, hand hygiene and skin antiseptic. A timeout was performed prior to the initiation of the procedure. A planning axial CT scan was performed with the patient in the supine window. There is a suboptimal window into the abscess cavity. Therefore, the patient was manipulated into the left lateral decubitus position. Repeat CT imaging was performed identifying a safe access into the right lower quadrant abscess. The skin was sterilely prepped and draped in the standard fashion using chlorhexidine skin prep. Local anesthesia was  attained by infiltration with 1% lidocaine. A small dermatotomy was made. Under intermittent CT guidance, an 18 gauge trocar needle was advanced into the fluid collection. A 0.035 wire was coiled in the fluid collection. The skin tract was  then dilated to 12 [place]. A 12 French all-purpose drainage catheter was advanced over the wire and formed in the fluid collection. Aspiration yields approximately 60 mL foul-smelling purulent fluid. Samples were sent for Gram stain and culture. The drainage catheter was flushed and connected to JP bulb suction before being secured to the skin with 0 Prolene suture. Post placement CT imaging demonstrates a well-positioned drainage catheter and no evidence of immediate complication. IMPRESSION: Successful placement of a 12 French drainage catheter into the right lower quadrant abscess. Electronically Signed   By: Jacqulynn Cadet M.D.   On: 01/14/2020 08:47    Labs:  CBC: Recent Labs    01/12/20 1209 01/13/20 0300 01/14/20 0500 01/15/20 0500  WBC 18.6* 15.2* 11.7* 11.1*  HGB 14.5 12.3* 13.4 12.3*  HCT 41.5 35.9* 39.2 35.7*  PLT 417* 254 228 237    COAGS: Recent Labs    01/12/20 1456 01/13/20 0300  INR 1.3* 1.3*    BMP: Recent Labs    01/12/20 1209 01/13/20 0300 01/14/20 0500 01/15/20 0500  NA 134* 137 137 140  K 3.4* 3.5 4.2 3.9  CL 96* 101 105 105  CO2 22 23 24 25   GLUCOSE 200* 121* 207* 162*  BUN 36* 36* 31* 23  CALCIUM 8.6* 7.9* 7.7* 8.0*  CREATININE 1.22 1.16 0.85 0.80  GFRNONAA >60 >60 >60 >60  GFRAA >60 >60 >60 >60    LIVER FUNCTION TESTS: Recent Labs    01/12/20 1209 01/13/20 0300 01/14/20 0500 01/15/20 0500  BILITOT 2.0* 2.4* 1.8* 1.6*  AST 42* 49* 57* 44*  ALT 51* 46* 51* 49*  ALKPHOS 90 79 111 104  PROT 7.0 5.8* 5.9* 5.7*  ALBUMIN 3.4* 2.6* 2.6* 2.4*    Assessment and Plan: 64 y.o, male inpatient. History of DM, HTN, HLD, chronic back pain. Presented to the ED with abdominal pain X 2 weeks found to have a perforated appendix. IR placed an abscess drain on 8.21.21. Per Epic output is:   5 ml ( 15 noted to be in the drain), 45 ml  WBC is 11.1 (down trending) cultures are in the process of re incubation. AST 44, ALT 49, Total bilirubin 1.6. All  other labs and medications are within acceptable parameters. Patient is afebrile.  Recommend team continue with flushing TID, output recording q shift and dressing changes as needed. Would consider additional imaging when output is less than 10 ml for 24 hours not including flush material.   Continue current treatment plans as per TR[facility] and [facility].   Electronically Signed: Jacqualine Mau, NP 01/15/2020, 3:03 PM   I spent a total of 15 Minutes at the patient's bedside AND on the patient's hospital floor or unit, greater than 50% of which was counseling/coordinating care for intra abdominal abscess drain for perforated appendix

## 2020-01-15 NOTE — ED Notes (Signed)
Patient has been repositioned in chair and assisted to the bathroom with the steady multiple times without incident.

## 2020-01-15 NOTE — ED Notes (Signed)
Patient reports he has not yet had a BM. Patient reports he believes it was just gas. Patient has been urinating appropriately and VSS.

## 2020-01-15 NOTE — Progress Notes (Signed)
CC:  Abdominal pain  Subjective: Still complaining of generalized abdominal pain with exam.  Says it's much better than before.  Some nausea with pain meds, taking with other PO's.  IR drain not charged, fluid in the bulb is serosanguinous.  Fluid in tubing is brownish-clear.    Objective: Vital signs in last 24 hours: Temp:  [98 F (36.7 C)-99.4 F (37.4 C)] 98.4 F (36.9 C) (08/22 2152) Pulse Rate:  [54-78] 63 (08/23 0700) Resp:  [16-27] 22 (08/23 0700) BP: (113-161)/(60-111) 143/74 (08/23 0700) SpO2:  [87 %-95 %] 95 % (08/23 0700)  NO I&O Afebrile, x 1 yesterday BP stable but up some yesterday Glucose 162 CMP Latest Ref Rng & Units 01/15/2020 01/14/2020 01/13/2020  Glucose 70 - 99 mg/dL 162(H) 207(H) 121(H)  Creatinine 0.61 - 1.24 mg/dL 0.80 0.85 1.16  Total Bilirubin 0.3 - 1.2 mg/dL 1.6(H) 1.8(H) 2.4(H)  Alkaline Phos 38 - 126 U/L 104 111 79  AST 15 - 41 U/L 44(H) 57(H) 49(H)  ALT 0 - 44 U/L 49(H) 51(H) 46(H)   WBC 11.1 Intake/Output from previous day: 08/22 0701 - 08/23 0700 In: 1355.2 [I.V.:781; IV Piggyback:569.2] Out: 1308 [Urine:1100; Drains:20] Intake/Output this shift: No intake/output data recorded.  General appearance: alert, cooperative, no distress and tired appearing Resp: clear to auscultation bilaterally GI: soft, tender to palpation, but does not have peritonitis, few BS  Lab Results:  Recent Labs    01/14/20 0500 01/15/20 0500  WBC 11.7* 11.1*  HGB 13.4 12.3*  HCT 39.2 35.7*  PLT 228 237    BMET Recent Labs    01/14/20 0500 01/15/20 0500  NA 137 140  K 4.2 3.9  CL 105 105  CO2 24 25  GLUCOSE 207* 162*  BUN 31* 23  CREATININE 0.85 0.80  CALCIUM 7.7* 8.0*   PT/INR Recent Labs    01/12/20 1456 01/13/20 0300  LABPROT 15.4* 15.9*  INR 1.3* 1.3*    Recent Labs  Lab 01/12/20 1209 01/13/20 0300 01/14/20 0500 01/15/20 0500  AST 42* 49* 57* 44*  ALT 51* 46* 51* 49*  ALKPHOS 90 79 111 104  BILITOT 2.0* 2.4* 1.8* 1.6*   PROT 7.0 5.8* 5.9* 5.7*  ALBUMIN 3.4* 2.6* 2.6* 2.4*     Lipase  No results found for: LIPASE   Prior to Admission medications   Medication Sig Start Date End Date Taking? Authorizing Provider  acetaminophen (TYLENOL) 500 MG tablet Take 500 mg by mouth every 6 (six) hours as needed for mild pain or headache.   Yes [provider]  amLODipine (NORVASC) 5 MG tablet Take 5 mg by mouth daily.   Yes [provider]  aspirin EC 81 MG tablet Take 81 mg by mouth daily.   Yes [provider]  atorvastatin (LIPITOR) 40 MG tablet Take 40 mg by mouth daily. 11/24/19  Yes [provider]  glipiZIDE (GLUCOTROL) 10 MG tablet Take 10 mg by mouth daily before breakfast.   Yes [provider]  lisinopril-hydrochlorothiazide (PRINZIDE,ZESTORETIC) 20-12.5 MG tablet Take 1 tablet by mouth daily.   Yes [provider]  LORazepam (ATIVAN) 1 MG tablet Take 1 mg by mouth every 8 (eight) hours as needed for anxiety.  11/29/19  Yes [provider]  metFORMIN (GLUCOPHAGE-XR) 500 MG 24 hr tablet Take 500 mg by mouth 2 (two) times daily. 08/29/19  Yes [provider]  methocarbamol (ROBAXIN) 500 MG tablet Take 500 mg by mouth every 6 (six) hours as needed for muscle  spasms.  11/29/19  Yes [provider]    Medications: . amLODipine  5 mg Oral Daily  . feeding supplement (GLUCERNA SHAKE)  237 mL Oral TID BM  . insulin aspart  0-15 Units Subcutaneous TID WC  . lip balm  1 application Topical BID  . pantoprazole (PROTONIX) IV  40 mg Intravenous Q24H  . senna-docusate  1 tablet Oral BID   . sodium chloride 100 mL/hr at 01/15/20 0032  . methocarbamol (ROBAXIN) IV    . ondansetron (ZOFRAN) IV    . piperacillin-tazobactam (ZOSYN)  IV 3.375 g (01/15/20 0717)   Assessment/Plan Sepsis  - WBC WBC 15.2>>11.1>>11.7(8/23) Degenerative disc disease Type II diabetes Hypertension BMI 35.95  Acute appendicitis with perforation IR drain  8/21  FEN: IV fluids/clear liquids ID: Zosyn 8/20  >> day 4 DVT: SCD/may have chemical prophylaxis from our standpoint Follow up:  Dr.Gross   Plan:  Day 3 in the ED, increase VS, add IS, OOB to chair at least, Continue antibiotics.  CT 8/20, IR drain 8/21.  Most likely repeat CT later this week.     LOS: 3 days    Albert Mayer 01/15/2020 Please see Amion

## 2020-01-15 NOTE — Progress Notes (Signed)
PROGRESS NOTE    Albert Mayer  AOZ:[id number] DOB: 01-22-1956 DOA: 01/12/2020 PCP: Christain Sacramento, MD    Chief Complaint  Patient presents with  . Abdominal Pain    Brief Narrative:  Patient 64 year old gentleman history of non-insulin-dependent diabetes mellitus type 2, hypertension, hyperlipidemia, chronic back pain, obesity presenting with a 2-week history of worsening abdominal pain, fever, chills, fatigue, nausea, vomiting.  Work-up in the ED CT abdomen and pelvis concerning for acute perforated appendicitis with abscess formation.  Patient placed empirically on IV Zosyn.  General surgery consulted who recommended IR for drain placement.   Assessment & Plan:   Principal Problem:   Acute appendicitis with perforation and peritoneal abscess Active Problems:   Degeneration of lumbar intervertebral disc   Type 2 diabetes mellitus (HCC)   Primary hypertension   Hyperglycemia   Severe obesity (BMI 35.0-35.9 with comorbidity) (HCC)   Hypokalemia   Sepsis ([place])   Severe sepsis with acute organ dysfunction (HCC)  1 severe sepsis secondary to intra-abdominal perforated appendicitis with abscess formation, POA Patient presented with a 2-week history of worsening abdominal pain.  CT abdomen and pelvis done with intra-abdominal abscesses concerning for perforated appendicitis.  Patient met criteria for sepsis with tachycardia, fever, leukocytosis, evidence of endorgan damage by elevated lactic acidosis of 5.1, transaminitis, elevated procalcitonin level.  Patient fever curve trended down.  Currently afebrile x48 hours.  Leukocytosis was trending down however labs pending for this morning.  Patient pancultured results pending.  Status post drain placement right lower quadrant abscess per IR 01/13/2020 with cultures sent.  Patient with some clinical improvement.  Patient tolerating clear liquids.  Decrease IV fluid rate.  Continue empiric IV Zosyn.  IR following.  General surgery following.   2.   Non-insulin-dependent diabetes mellitus type 2 with hyperglycemia Hemoglobin A1c 8.1 (01/12/2020).  CBG of 141 this morning.  Patient noted to have a CBG of 55 (01/13/2020 however was n.p.o.  Patient on clears.  Decrease IV fluid rate to 75 cc/h.  Continue to hold home oral hypoglycemic agents.  Sliding scale insulin.    3.  Hypokalemia Likely secondary to GI losses.  Patient currently on clears.  Potassium supplement and potassium at 3.9.  Follow.   4.  Mild hyponatremia Likely secondary to hypovolemic hyponatremia secondary to problem #1 in the setting of HCTZ.  HCTZ discontinued.  Sodium levels improved with hydration.  Follow.    5.  Elevated liver enzymes/transaminitis Likely secondary to problem #1.  LFTs slowly trending down/fluctuating.  Follow.   6.  Leukocytosis Secondary to problem #1.  Patient pancultured.  Status post drain placement in right lower quadrant abscess with cultures sent and pending.  Continue IV Zosyn.  7.  Hypertension Patient with some complaints of congestion/?  Shortness of breath.  Decrease IV fluids to 75 cc/h.  Increase Norvasc to 10 mg daily.   8.  Chronic back pain Continue current pain regimen.   9.  Pulmonary nodules Incidental finding.  Repeat CT chest in 3 to 6 months.  Outpatient follow-up.    10.  Morbid obesity Outpatient follow-up.  11.  Deconditioning/debility PT/OT.   DVT prophylaxis: SCDs Code Status: Full Family Communication: Updated patient.  No family at bedside. Disposition:   Status is: Inpatient    Dispo: The patient is from: Home              Anticipated d/c is to: Home              Anticipated  d/c date is: To be determined.              Patient currently receiving IV antibiotics, admitted for perforated appendicitis with abscess.  Status post drain placement.  On clears.  Not medically stable for discharge.         Consultants:   General surgery: Dr. Johney Maine 01/12/2020  Interventional radiology: Dr. Jacqulynn Cadet 01/12/2020  Procedures:   CT abdomen and pelvis 01/12/2020  CT-guided placement of 12 French gauge drainage catheter into right lower quadrant abscess per Dr. Jacqulynn Cadet, IR 01/13/2020  Antimicrobials:   IV Zosyn 01/12/2020>>>   Subjective: Patient sitting on commode trying to have a bowel movement.  Complain of some congestion/?  S OB.  Denies chest pain.  States abdominal pain improved from admission.  No emesis.  Endorses some nausea.  Tolerating clears.    Objective: Vitals:   01/15/20 0630 01/15/20 0700 01/15/20 0749 01/15/20 0827  BP: (!) 159/81 (!) 143/74 (!) 158/80 (!) 152/90  Pulse: 78 63 65 76  Resp: 17 (!) 22 20 (!) 23  Temp:   98.4 F (36.9 C)   TempSrc:   Oral   SpO2: 91% 95% 95% 94%  Weight:      Height:        Intake/Output Summary (Last 24 hours) at 01/15/2020 0901 Last data filed at 01/15/2020 0347 Gross per 24 hour  Intake 1355.15 ml  Output 1195 ml  Net 160.15 ml   Filed Weights   01/12/20 1421  Weight: 127 kg    Examination:  General exam: NAD Respiratory system: Decreased breath sounds in the bases.  Some right basilar crackle.  No rhonchi.  No wheezing.  Speaking in full sentences.  Normal respiratory effort.   Cardiovascular system: RRR no murmurs rubs or gallops.  No JVD.  No lower extremity edema.   Gastrointestinal system: Abdomen is obese, soft, mildly distended, decreased diffuse tenderness to palpation.  Drain noted in right flank with serosanguineous drainage in bulb. Central nervous system: Alert and oriented. No focal neurological deficits. Extremities: Symmetric 5 x 5 power. Skin: No rashes, lesions or ulcers Psychiatry: Judgement and insight appear normal. Mood & affect appropriate.     Data Reviewed: I have personally reviewed following labs and imaging studies  CBC: Recent Labs  Lab 01/12/20 1209 01/13/20 0300 01/14/20 0500 01/15/20 0500  WBC 18.6* 15.2* 11.7* 11.1*  NEUTROABS  --   --  8.9* 8.3*  HGB  14.5 12.3* 13.4 12.3*  HCT 41.5 35.9* 39.2 35.7*  MCV 87.0 89.1 89.3 90.2  PLT 417* 254 228 939    Basic Metabolic Panel: Recent Labs  Lab 01/12/20 1209 01/13/20 0300 01/14/20 0500 01/15/20 0500  NA 134* 137 137 140  K 3.4* 3.5 4.2 3.9  CL 96* 101 105 105  CO2 22 23 24 25   GLUCOSE 200* 121* 207* 162*  BUN 36* 36* 31* 23  CREATININE 1.22 1.16 0.85 0.80  CALCIUM 8.6* 7.9* 7.7* 8.0*  MG  --   --  2.3 1.9  PHOS  --   --  2.2* 2.6    GFR: Estimated Creatinine Clearance: 130.3 mL/min (by C-G formula based on SCr of 0.8 mg/dL).  Liver Function Tests: Recent Labs  Lab 01/12/20 1209 01/13/20 0300 01/14/20 0500 01/15/20 0500  AST 42* 49* 57* 44*  ALT 51* 46* 51* 49*  ALKPHOS 90 79 111 104  BILITOT 2.0* 2.4* 1.8* 1.6*  PROT 7.0 5.8* 5.9* 5.7*  ALBUMIN 3.4* 2.6* 2.6*  2.4*    CBG: Recent Labs  Lab 01/14/20 0754 01/14/20 1214 01/14/20 1642 01/14/20 2228 01/15/20 0831  GLUCAP 175* 163* 165* 134* 141*     Recent Results (from the past 240 hour(s))  Urine culture     Status: Abnormal   Collection Time: 01/12/20  2:06 PM   Specimen: Urine, Random  Result Value Ref Range Status   Specimen Description   Final    URINE, RANDOM Performed at [facility] [address] [place], [place] [address]    Special Requests   Final    NONE Performed at [facility], [place] [address], [place], [place] [address]    Culture (A)  Final    <10,000 COLONIES/mL INSIGNIFICANT GROWTH Performed at [facility] [address] [place], [place] [address]    Report Status 01/14/2020 FINAL  Final  Blood culture (routine x 2)     Status: None (Preliminary result)   Collection Time: 01/12/20  2:33 PM   Specimen: BLOOD  Result Value Ref Range Status   Specimen Description   Final    BLOOD RIGHT ANTECUBITAL Performed at [facility], [place] [address] [place], [place] [address]    Special Requests   Final    BOTTLES DRAWN AEROBIC AND  ANAEROBIC Blood Culture results may not be optimal due to an excessive volume of blood received in culture bottles Performed at [facility] [address], [place], [place] [address]    Culture   Final    NO GROWTH 3 DAYS Performed at [facility], [place] [address], [place], [place] [address]    Report Status PENDING  Incomplete  Blood culture (routine x 2)     Status: None (Preliminary result)   Collection Time: 01/12/20  2:38 PM   Specimen: BLOOD  Result Value Ref Range Status   Specimen Description   Final    BLOOD LEFT WRIST Performed at [facility] [address], [place], [place] [address]    Special Requests   Final    BOTTLES DRAWN AEROBIC AND ANAEROBIC Blood Culture adequate volume Performed at [facility] [address], [place], [place] [address]    Culture   Final    NO GROWTH 3 DAYS Performed at [facility], [place] [address] [place],  [address]    Report Status PENDING  Incomplete  SARS Coronavirus 2 by RT PCR (hospital order, performed in [facility] hospital lab) Nasopharyngeal Nasopharyngeal Swab     Status: None   Collection Time: 01/13/20  9:04 AM   Specimen: Nasopharyngeal Swab  Result Value Ref Range Status   SARS Coronavirus 2 NEGATIVE NEGATIVE Final    Comment: (NOTE) SARS-CoV-2 target nucleic acids are NOT DETECTED.  The SARS-CoV-2 RNA is generally detectable in upper and lower respiratory specimens during the acute phase of infection. The lowest concentration of SARS-CoV-2 viral copies this assay can detect is 250 copies / mL. A negative result does not preclude SARS-CoV-2 infection and should not be used as the sole basis for treatment or other patient management decisions.  A negative result may occur with improper specimen collection / handling, submission of specimen other than nasopharyngeal swab, presence of viral mutation(s) within the areas targeted by this assay,  and inadequate number of viral copies (<250 copies / mL). A negative result must be combined with clinical observations, patient history, and epidemiological information.  Fact Sheet for Patients:  StrictlyIdeas.no  Fact Sheet for Healthcare Providers: BankingDealers.co.za  This test is not yet approved or  cleared by the [place] FDA and has been authorized for detection and/or diagnosis of SARS-CoV-2 by FDA under an Emergency Use Authorization (EUA).  This EUA will remain in effect (meaning this test can be used) for the duration of the COVID-19 declaration under Section 564(b)(1) of the Act, 21 U.S.C. section 360bbb-3(b)(1), unless the authorization is terminated or revoked sooner.  Performed at [facility], [place] [address] [place], [place] [address]   Aerobic/Anaerobic Culture (surgical/deep wound)     Status: None (Preliminary result)   Collection Time: 01/13/20  3:55 PM   Specimen: Abscess  Result Value Ref Range Status   Specimen Description   Final    ABSCESS Performed at [facility] [address], [place], [place] [address]    Special Requests   Final    NONE Performed at [facility], [place] [address] [place], [place] [address]    Gram Stain NO WBC SEEN RARE GRAM NEGATIVE RODS   Final   Culture   Final    CULTURE REINCUBATED FOR BETTER GROWTH Performed at [facility], [place] [address], [place], [place] [address]    Report Status PENDING  Incomplete         Radiology Studies: CT IMAGE GUIDED DRAINAGE BY PERCUTANEOUS CATHETER  Result Date: 01/14/2020 INDICATION: 64 year old male with right lower quadrant intra-abdominal abscess favored to represent perforated appendicitis. He presents for percutaneous drain placement. EXAM: CT abscess drain placement MEDICATIONS: The patient is currently admitted to the hospital and receiving intravenous  antibiotics. The antibiotics were administered within an appropriate time frame prior to the initiation of the procedure. ANESTHESIA/SEDATION: Fentanyl 25 mcg IV; Versed 0.5 mg IV Moderate Sedation Time:  12 minutes The patient was continuously monitored during the procedure by the interventional radiology nurse under my direct supervision. COMPLICATIONS: None immediate. PROCEDURE: Informed written consent was obtained from the patient after a thorough discussion of the procedural risks, benefits and alternatives. All questions were addressed. Maximal Sterile Barrier Technique was utilized including caps, mask, sterile gowns, sterile gloves, sterile drape, hand hygiene and skin antiseptic. A timeout was performed prior to the initiation of the procedure. A planning axial CT scan was performed with the patient in the supine window. There is a suboptimal window into the abscess cavity. Therefore, the patient was manipulated into the left lateral decubitus position. Repeat CT imaging was performed identifying a safe access into the right lower quadrant abscess. The skin was sterilely prepped and draped in the standard fashion using chlorhexidine skin prep. Local anesthesia was attained by infiltration with 1% lidocaine. A small dermatotomy was made. Under intermittent CT guidance, an 18 gauge trocar needle was advanced into the fluid collection. A 0.035 wire was coiled in the fluid collection. The skin tract was then dilated to 12 [place]. A 12 French all-purpose drainage catheter was advanced over the wire and formed in the fluid collection. Aspiration yields approximately 60 mL foul-smelling purulent fluid. Samples were sent for Gram stain and culture. The drainage catheter was flushed and connected to JP bulb suction before being secured to the skin with 0 Prolene suture. Post placement CT imaging demonstrates a well-positioned drainage catheter and no evidence of immediate complication. IMPRESSION: Successful  placement of a 12 French drainage catheter into the right lower quadrant abscess. Electronically Signed   By: Jacqulynn Cadet M.D.   On: 01/14/2020 08:47  Scheduled Meds: . acetaminophen  1,000 mg Oral Q8H  . amLODipine  10 mg Oral Daily  . feeding supplement (GLUCERNA SHAKE)  237 mL Oral TID BM  . insulin aspart  0-15 Units Subcutaneous TID WC  . lip balm  1 application Topical BID  . pantoprazole (PROTONIX) IV  40 mg Intravenous Q24H  . senna-docusate  1 tablet Oral BID   Continuous Infusions: . sodium chloride 100 mL/hr at 01/15/20 0032  . methocarbamol (ROBAXIN) IV    . ondansetron (ZOFRAN) IV    . piperacillin-tazobactam (ZOSYN)  IV 3.375 g (01/15/20 0717)     LOS: 3 days    Time spent: 40 minutes    Irine Seal, MD [facility]   To contact the attending provider between 7A-7P or the covering provider during after hours 7P-7A, please log into the web site [email or web address] and access using universal [place] password for that web site. If you do not have the password, please call the hospital operator.  01/15/2020, 9:01 AM

## 2020-01-16 LAB — CYTOLOGY - NON PAP

## 2020-01-16 LAB — BLOOD CULTURE ID PANEL (REFLEXED) - BCID2

## 2020-01-16 LAB — CBC
HCT: 38 % — ABNORMAL LOW (ref 39.0–52.0)
Hemoglobin: 12.9 g/dL — ABNORMAL LOW (ref 13.0–17.0)
MCH: 30.6 pg (ref 26.0–34.0)
MCHC: 33.9 g/dL (ref 30.0–36.0)
MCV: 90.3 fL (ref 80.0–100.0)
Platelets: 257 10*3/uL (ref 150–400)
RBC: 4.21 MIL/uL — ABNORMAL LOW (ref 4.22–5.81)
RDW: 13.1 % (ref 11.5–15.5)
WBC: 8.5 10*3/uL (ref 4.0–10.5)
nRBC: 0 % (ref 0.0–0.2)

## 2020-01-16 LAB — COMPREHENSIVE METABOLIC PANEL
ALT: 46 U/L — ABNORMAL HIGH (ref 0–44)
AST: 37 U/L (ref 15–41)
Albumin: 2.5 g/dL — ABNORMAL LOW (ref 3.5–5.0)
Alkaline Phosphatase: 102 U/L (ref 38–126)
Anion gap: 9 (ref 5–15)
BUN: 15 mg/dL (ref 8–23)
CO2: 28 mmol/L (ref 22–32)
Calcium: 8.4 mg/dL — ABNORMAL LOW (ref 8.9–10.3)
Chloride: 103 mmol/L (ref 98–111)
Creatinine, Ser: 0.78 mg/dL (ref 0.61–1.24)
GFR calc Af Amer: >60 mL/min (ref >60)
GFR calc non Af Amer: >60 mL/min (ref >60)
Glucose, Bld: 141 mg/dL — ABNORMAL HIGH (ref 70–99)
Potassium: 4 mmol/L (ref 3.5–5.1)
Sodium: 140 mmol/L (ref 135–145)
Total Bilirubin: 1.4 mg/dL — ABNORMAL HIGH (ref 0.3–1.2)
Total Protein: 5.8 g/dL — ABNORMAL LOW (ref 6.5–8.1)

## 2020-01-16 LAB — GLUCOSE, CAPILLARY
Glucose-Capillary: 152 mg/dL — ABNORMAL HIGH (ref 70–99)
Glucose-Capillary: 225 mg/dL — ABNORMAL HIGH (ref 70–99)
Glucose-Capillary: 148 mg/dL — ABNORMAL HIGH (ref 70–99)
Glucose-Capillary: 154 mg/dL — ABNORMAL HIGH (ref 70–99)

## 2020-01-16 LAB — PROCALCITONIN: Procalcitonin: 11.2 ng/mL

## 2020-01-16 LAB — MAGNESIUM: Magnesium: 1.9 mg/dL (ref 1.7–2.4)

## 2020-01-16 MED ORDER — ADULT MULTIVITAMIN W/MINERALS CH
1.0000 | ORAL_TABLET | Freq: Every day | ORAL | Status: DC
  Administered 2020-01-17 – 2020-01-18 (×2): 1 via ORAL
  Filled 2020-01-16 (×2): qty 1

## 2020-01-16 MED ORDER — KATE FARMS STANDARD 1.4 PO LIQD
325.0000 mL | Freq: Every day | ORAL | Status: DC
  Filled 2020-01-16 (×3): qty 325

## 2020-01-16 MED ORDER — PROSOURCE PLUS PO LIQD
30.0000 mL | Freq: Two times a day (BID) | ORAL | Status: DC
  Administered 2020-01-16: 30 mL via ORAL
  Filled 2020-01-16: qty 30

## 2020-01-16 NOTE — Progress Notes (Signed)
PROGRESS NOTE    Albert Mayer  HRC:[id number] DOB: Jan 28, 1956 DOA: 01/12/2020 PCP: Christain Sacramento, MD    Chief Complaint  Patient presents with  . Abdominal Pain    Brief Narrative:  Patient 64 year old gentleman history of non-insulin-dependent diabetes mellitus type 2, hypertension, hyperlipidemia, chronic back pain, obesity presenting with a 2-week history of worsening abdominal pain, fever, chills, fatigue, nausea, vomiting.  Work-up in the ED CT abdomen and pelvis concerning for acute perforated appendicitis with abscess formation.  Patient placed empirically on IV Zosyn.  General surgery consulted who recommended IR for drain placement.   Assessment & Plan:   Principal Problem:   Acute appendicitis with perforation and peritoneal abscess Active Problems:   Degeneration of lumbar intervertebral disc   Type 2 diabetes mellitus (HCC)   Primary hypertension   Hyperglycemia   Severe obesity (BMI 35.0-35.9 with comorbidity) (HCC)   Hypokalemia   Sepsis ([place])   Severe sepsis with acute organ dysfunction (HCC)  1 severe sepsis secondary to intra-abdominal perforated appendicitis with abscess formation, POA Patient presented with a 2-week history of worsening abdominal pain.  CT abdomen and pelvis done with intra-abdominal abscesses concerning for perforated appendicitis.  Patient met criteria for sepsis with tachycardia, fever, leukocytosis, evidence of endorgan damage by elevated lactic acidosis of 5.1, transaminitis, elevated procalcitonin level.  Patient fever curve trended down.  Currently afebrile x 72 hours.  Leukocytosis trending down.  Patient pancultured results pending.  Status post drain placement right lower quadrant abscess per IR 01/13/2020 with cultures sent with preliminary culture results Streptococcus Gordonii.  Tolerating clears, diet advanced to full liquid diet per general surgery.  Saline lock IV fluids.  Continue empiric IV Zosyn.  IR and general surgery following.   May need ID input once cultures have been finalized.  2.  Non-insulin-dependent diabetes mellitus type 2 with hyperglycemia Hemoglobin A1c 8.1 (01/12/2020).  CBG of 152 this morning.  Patient noted to have a CBG of 55 (01/13/2020 however was n.p.o.  Patient on clears advance to a full liquid diet.  Saline lock IV fluids.  Continue to hold home oral hypoglycemic agents.  Sliding scale insulin..   3.  Hypokalemia Likely secondary to GI losses.  Repleted.  Potassium at 4.0. Follow.   4.  Mild hyponatremia Likely secondary to hypovolemic hyponatremia secondary to problem #1 in the setting of HCTZ.  HCTZ discontinued.  Natremia resolved with hydration.  Saline lock IV fluids.   5.  Elevated liver enzymes/transaminitis Likely secondary to problem #1.  LFTs slowly trending down/fluctuating.  Follow.   6.  Leukocytosis Secondary to problem #1.  Patient pancultured.  Status post drain placement in right lower quadrant abscess with cultures sent and preliminary findings of moderate Streptococcus gordonii.  Continue IV Zosyn.    7.  Hypertension Patient with some complaints of congestion and shortness of breath on 01/15/2020 which has since resolved.  Saline lock IV fluids.  Norvasc 10 mg daily.  8.  Chronic back pain Continue current pain regimen.   9.  Pulmonary nodules Incidental finding.  Repeat CT chest in 3 to 6 months.  Outpatient follow-up.    10.  Morbid obesity Outpatient follow-up.  11.  Deconditioning/debility PT/OT.  Will likely need home health therapies.   DVT prophylaxis: SCDs Code Status: Full Family Communication: Updated patient.  No family at bedside. Disposition:   Status is: Inpatient    Dispo: The patient is from: Home  Anticipated d/c is to: Home              Anticipated d/c date is: To be determined.              Patient currently receiving IV antibiotics, admitted for perforated appendicitis with abscess.  Status post drain placement.  On full  liquids.  Not medically stable for discharge.        Consultants:   General surgery: Dr. Johney Maine 01/12/2020  Interventional radiology: Dr. Jacqulynn Cadet 01/12/2020  Procedures:   CT abdomen and pelvis 01/12/2020  CT-guided placement of 12 French gauge drainage catheter into right lower quadrant abscess per Dr. Jacqulynn Cadet, IR 01/13/2020  Antimicrobials:   IV Zosyn 01/12/2020>>>   Subjective: Patient noted to be ambulating in hallway with a walker and nurse tech.  States abdominal pain improving daily.  Denies any chest pain or shortness of breath.  No emesis.  Tolerating clears.  States his diet has been advanced to a full liquid diet.    Objective: Vitals:   01/15/20 1736 01/15/20 2101 01/15/20 2300 01/16/20 0541  BP: (!) 157/84 (!) 162/77  (!) 152/71  Pulse: 74 63  (!) 59  Resp: (!) 22 18 20 19   Temp: 98.7 F (37.1 C) 98.4 F (36.9 C)  98.5 F (36.9 C)  TempSrc: Oral Oral  Oral  SpO2: 98% 94%  95%  Weight:      Height:        Intake/Output Summary (Last 24 hours) at 01/16/2020 1109 Last data filed at 01/16/2020 0700 Gross per 24 hour  Intake 706.22 ml  Output 25 ml  Net 681.22 ml   Filed Weights   01/12/20 1421  Weight: 127 kg    Examination:  General exam: NAD Respiratory system: CTAB.  No wheezes, no crackles, no rhonchi.  Normal respiratory effort.  Cardiovascular system: Regular rate rhythm no murmurs rubs or gallops.  No JVD.  No lower extremity edema.   Gastrointestinal system: Abdomen is obese, decreased tenderness to palpation, drain in the right flank with serosanguineous drainage from bone.  Central nervous system: Alert and oriented. No focal neurological deficits. Extremities: Symmetric 5 x 5 power. Skin: No rashes, lesions or ulcers Psychiatry: Judgement and insight appear normal. Mood & affect appropriate.     Data Reviewed: I have personally reviewed following labs and imaging studies  CBC: Recent Labs  Lab 01/12/20 1209  01/13/20 0300 01/14/20 0500 01/15/20 0500 01/16/20 0547  WBC 18.6* 15.2* 11.7* 11.1* 8.5  NEUTROABS  --   --  8.9* 8.3*  --   HGB 14.5 12.3* 13.4 12.3* 12.9*  HCT 41.5 35.9* 39.2 35.7* 38.0*  MCV 87.0 89.1 89.3 90.2 90.3  PLT 417* 254 228 237 680    Basic Metabolic Panel: Recent Labs  Lab 01/12/20 1209 01/13/20 0300 01/14/20 0500 01/15/20 0500 01/16/20 0547  NA 134* 137 137 140 140  K 3.4* 3.5 4.2 3.9 4.0  CL 96* 101 105 105 103  CO2 22 23 24 25 28   GLUCOSE 200* 121* 207* 162* 141*  BUN 36* 36* 31* 23 15  CREATININE 1.22 1.16 0.85 0.80 0.78  CALCIUM 8.6* 7.9* 7.7* 8.0* 8.4*  MG  --   --  2.3 1.9 1.9  PHOS  --   --  2.2* 2.6  --     GFR: Estimated Creatinine Clearance: 130.3 mL/min (by C-G formula based on SCr of 0.78 mg/dL).  Liver Function Tests: Recent Labs  Lab 01/12/20 1209 01/13/20 0300 01/14/20  0500 01/15/20 0500 01/16/20 0547  AST 42* 49* 57* 44* 37  ALT 51* 46* 51* 49* 46*  ALKPHOS 90 79 111 104 102  BILITOT 2.0* 2.4* 1.8* 1.6* 1.4*  PROT 7.0 5.8* 5.9* 5.7* 5.8*  ALBUMIN 3.4* 2.6* 2.6* 2.4* 2.5*    CBG: Recent Labs  Lab 01/15/20 0831 01/15/20 1217 01/15/20 1733 01/15/20 2059 01/16/20 0756  GLUCAP 141* 189* 258* 145* 152*     Recent Results (from the past 240 hour(s))  Urine culture     Status: Abnormal   Collection Time: 01/12/20  2:06 PM   Specimen: Urine, Random  Result Value Ref Range Status   Specimen Description   Final    URINE, RANDOM Performed at [facility] [address] [place], [place] [address]    Special Requests   Final    NONE Performed at [facility], [place] [address], [place], [place] [address]    Culture (A)  Final    <10,000 COLONIES/mL INSIGNIFICANT GROWTH Performed at [facility] [address], [place], [place] [address]    Report Status 01/14/2020 FINAL  Final  Blood culture (routine x 2)     Status: None (Preliminary result)   Collection Time: 01/12/20   2:33 PM   Specimen: BLOOD  Result Value Ref Range Status   Specimen Description   Final    BLOOD RIGHT ANTECUBITAL Performed at [facility], [place] [address] [place], [place] [address]    Special Requests   Final    BOTTLES DRAWN AEROBIC AND ANAEROBIC Blood Culture results may not be optimal due to an excessive volume of blood received in culture bottles Performed at [facility] [address], [place], [place] [address]    Culture   Final    HOLDING FOR POSSIBLE ANAEROBE Performed at [facility], [place] [address], [place], [place] [address]    Report Status PENDING  Incomplete  Blood culture (routine x 2)     Status: None (Preliminary result)   Collection Time: 01/12/20  2:38 PM   Specimen: BLOOD  Result Value Ref Range Status   Specimen Description   Final    BLOOD LEFT WRIST Performed at [facility] [address] [place], [place] [address]    Special Requests   Final    BOTTLES DRAWN AEROBIC AND ANAEROBIC Blood Culture adequate volume Performed at [facility] [address], [place],  [address]    Culture  Setup Time   Final    GRAM POSITIVE RODS ANAEROBIC BOTTLE ONLY CRITICAL RESULT CALLED TO, READ BACK BY AND VERIFIED WITH: Armstrong @2340  01/15/20 EB Performed at [facility], [place] [address], [place],  [address]    Culture GRAM POSITIVE RODS  Final   Report Status PENDING  Incomplete  Blood Culture ID Panel (Reflexed)     Status: None   Collection Time: 01/12/20  2:38 PM  Result Value Ref Range Status   Enterococcus faecalis NOT DETECTED NOT DETECTED Final   Enterococcus Faecium NOT DETECTED NOT DETECTED Final   Listeria monocytogenes NOT DETECTED NOT DETECTED Final   Staphylococcus species NOT DETECTED NOT DETECTED Final   Staphylococcus aureus (BCID) NOT DETECTED NOT DETECTED Final   Staphylococcus epidermidis NOT DETECTED NOT DETECTED Final   Staphylococcus lugdunensis  NOT DETECTED NOT DETECTED Final   Streptococcus species NOT DETECTED NOT DETECTED Final   Streptococcus agalactiae NOT DETECTED NOT DETECTED Final  Streptococcus pneumoniae NOT DETECTED NOT DETECTED Final   Streptococcus pyogenes NOT DETECTED NOT DETECTED Final   A.calcoaceticus-baumannii NOT DETECTED NOT DETECTED Final   Bacteroides fragilis NOT DETECTED NOT DETECTED Final   Enterobacterales NOT DETECTED NOT DETECTED Final   Enterobacter cloacae complex NOT DETECTED NOT DETECTED Final   Escherichia coli NOT DETECTED NOT DETECTED Final   Klebsiella aerogenes NOT DETECTED NOT DETECTED Final   Klebsiella oxytoca NOT DETECTED NOT DETECTED Final   Klebsiella pneumoniae NOT DETECTED NOT DETECTED Final   Proteus species NOT DETECTED NOT DETECTED Final   Salmonella species NOT DETECTED NOT DETECTED Final   Serratia marcescens NOT DETECTED NOT DETECTED Final   Haemophilus influenzae NOT DETECTED NOT DETECTED Final   Neisseria meningitidis NOT DETECTED NOT DETECTED Final   Pseudomonas aeruginosa NOT DETECTED NOT DETECTED Final   Stenotrophomonas maltophilia NOT DETECTED NOT DETECTED Final   Candida albicans NOT DETECTED NOT DETECTED Final   Candida auris NOT DETECTED NOT DETECTED Final   Candida glabrata NOT DETECTED NOT DETECTED Final   Candida krusei NOT DETECTED NOT DETECTED Final   Candida parapsilosis NOT DETECTED NOT DETECTED Final   Candida tropicalis NOT DETECTED NOT DETECTED Final   Cryptococcus neoformans/gattii NOT DETECTED NOT DETECTED Final    Comment: Performed at [facility], [address], [place], [place] [address]  SARS Coronavirus 2 by RT PCR (hospital order, performed in [facility] hospital lab) Nasopharyngeal Nasopharyngeal Swab     Status: None   Collection Time: 01/13/20  9:04 AM   Specimen: Nasopharyngeal Swab  Result Value Ref Range Status   SARS Coronavirus 2 NEGATIVE NEGATIVE Final    Comment: (NOTE) SARS-CoV-2 target nucleic acids are NOT  DETECTED.  The SARS-CoV-2 RNA is generally detectable in upper and lower respiratory specimens during the acute phase of infection. The lowest concentration of SARS-CoV-2 viral copies this assay can detect is 250 copies / mL. A negative result does not preclude SARS-CoV-2 infection and should not be used as the sole basis for treatment or other patient management decisions.  A negative result may occur with improper specimen collection / handling, submission of specimen other than nasopharyngeal swab, presence of viral mutation(s) within the areas targeted by this assay, and inadequate number of viral copies (<250 copies / mL). A negative result must be combined with clinical observations, patient history, and epidemiological information.  Fact Sheet for Patients:   StrictlyIdeas.no  Fact Sheet for Healthcare Providers: BankingDealers.co.za  This test is not yet approved or  cleared by the [place] FDA and has been authorized for detection and/or diagnosis of SARS-CoV-2 by FDA under an Emergency Use Authorization (EUA).  This EUA will remain in effect (meaning this test can be used) for the duration of the COVID-19 declaration under Section 564(b)(1) of the Act, 21 U.S.C. section 360bbb-3(b)(1), unless the authorization is terminated or revoked sooner.  Performed at [facility], [place] [address] [place], [place] [address]   Aerobic/Anaerobic Culture (surgical/deep wound)     Status: None (Preliminary result)   Collection Time: 01/13/20  3:55 PM   Specimen: Abscess  Result Value Ref Range Status   Specimen Description   Final    ABSCESS Performed at [facility] [address] [place], [place] [address]    Special Requests   Final    NONE Performed at [facility], [place] [address] [place], [place] [address]    Gram Stain   Final    NO  WBC SEEN RARE GRAM NEGATIVE  RODS Performed at [facility], [place] [address], [place], [place] [address]    Culture   Final    CULTURE REINCUBATED FOR BETTER GROWTH NO ANAEROBES ISOLATED; CULTURE IN PROGRESS FOR 5 DAYS    Report Status PENDING  Incomplete         Radiology Studies: No results found.      Scheduled Meds: . acetaminophen  1,000 mg Oral Q8H  . amLODipine  10 mg Oral Daily  . feeding supplement (GLUCERNA SHAKE)  237 mL Oral TID BM  . insulin aspart  0-15 Units Subcutaneous TID WC  . lip balm  1 application Topical BID  . pantoprazole (PROTONIX) IV  40 mg Intravenous Q24H  . senna-docusate  1 tablet Oral BID   Continuous Infusions: . methocarbamol (ROBAXIN) IV    . ondansetron (ZOFRAN) IV    . piperacillin-tazobactam (ZOSYN)  IV 3.375 g (01/16/20 0604)     LOS: 4 days    Time spent: 40 minutes    Irine Seal, MD [facility]   To contact the attending provider between 7A-7P or the covering provider during after hours 7P-7A, please log into the web site [email or web address] and access using universal [place] password for that web site. If you do not have the password, please call the hospital operator.  01/16/2020, 11:09 AM

## 2020-01-16 NOTE — Progress Notes (Signed)
   Allred, PA-C placed an order for cytology of JP drain fluid (@1500  today).   Collected sample from JP drain, and then saw that order was then discontinued at 1552.   (per Order History Review).    Therefore, sample of JP drain fluid was not sent.     [place], Therapist, sports

## 2020-01-16 NOTE — Evaluation (Signed)
Physical Therapy Evaluation Patient Details Name: Markevious Ehmke MRN: [id number] DOB: [birth year] Today's Date: 01/16/2020   History of Present Illness  64 y.o. male with history of NIDDM-2, HTN, HLD, chronic back pain and obesity presenting with abdominal pain. Dx of appendicitis with perforation and peritoneal abscess. s/p IR drain 01/13/20.  Clinical Impression  Pt admitted with above diagnosis. Pt ambulated 1' with RW, distance limited by fatigue (pt had walked in the hall earlier this morning with RW so was tired at start of PT session). Mod assist for bed mobility. Instructed pt in BLE strengthening exercises to be done independently. Encouraged pt to ambulate 2-3x/day and to progress distance gradually as tolerated. Pt currently with functional limitations due to the deficits listed below (see PT Problem List). Pt will benefit from skilled PT to increase their independence and safety with mobility to allow discharge to the venue listed below.       Follow Up Recommendations Home health PT    Equipment Recommendations  Rolling walker with 5" wheels;3in1 (PT)    Recommendations for Other Services       Precautions / Restrictions Precautions Precautions: Fall Precaution Comments: abdominal drain      Mobility  Bed Mobility Overal bed mobility: Needs Assistance Bed Mobility: Rolling;Sidelying to Sit Rolling: Mod assist Sidelying to sit: Mod assist       General bed mobility comments: VCs for log roll technique, mod A to initiate roll, mod A to raise trunk  Transfers Overall transfer level: Needs assistance Equipment used: Rolling walker (2 wheeled) Transfers: Sit to/from Stand Sit to Stand: Min assist;From elevated surface         General transfer comment: VCs hand placement, min A to power up  Ambulation/Gait Ambulation/Gait assistance: Min guard Gait Distance (Feet): 40 Feet Assistive device: Rolling walker (2 wheeled) Gait Pattern/deviations: Step-through  pattern;Decreased stride length Gait velocity: decr   General Gait Details: steady, distance limited by fatigue, pt had recently walked in hall with nursing so was tired from that  [employer] Mobility    Modified Rankin (Stroke Patients Only)       Balance Overall balance assessment: Modified Independent                                           Pertinent Vitals/Pain Pain Assessment: 0-10 Pain Score: 6  Pain Location: abdomen Pain Descriptors / Indicators: Sore Pain Intervention(s): Limited activity within patient's tolerance;Monitored during session    Home Living Family/patient expects to be discharged to:: Private residence Living Arrangements: Alone Available Help at Discharge: Friend(s);Family;Available PRN/intermittently   Home Access: Stairs to enter Entrance Stairs-Rails: None Entrance Stairs-Number of Steps: 4 Home Layout: One level Home Equipment: None      Prior Function Level of Independence: Independent               Hand Dominance        Extremity/Trunk Assessment   Upper Extremity Assessment Upper Extremity Assessment: Overall WFL for tasks assessed    Lower Extremity Assessment Lower Extremity Assessment: Overall WFL for tasks assessed       Communication   Communication: No difficulties  Cognition Arousal/Alertness: Awake/alert Behavior During Therapy: WFL for tasks assessed/performed Overall Cognitive Status: Within Functional Limits for tasks assessed  General Comments      Exercises General Exercises - Lower Extremity Ankle Circles/Pumps: AROM;Both;5 reps;Supine Quad Sets: AROM;Both;5 reps;Supine   Assessment/Plan    PT Assessment Patient needs continued PT services  PT Problem List Decreased mobility;Decreased activity tolerance;Pain;Decreased knowledge of use of DME       PT Treatment Interventions DME  instruction;Gait training;Stair training;Functional mobility training;Therapeutic exercise;Therapeutic activities    PT Goals (Current goals can be found in the Care Plan section)  Acute Rehab PT Goals Patient Stated Goal: yardwork, fishing, get strength back PT Goal Formulation: With patient Time For Goal Achievement: 01/30/20 Potential to Achieve Goals: Good    Frequency Min 3X/week   Barriers to discharge        Co-evaluation               AM-PAC PT "6 Clicks" Mobility  Outcome Measure Help needed turning from your back to your side while in a flat bed without using bedrails?: A Lot Help needed moving from lying on your back to sitting on the side of a flat bed without using bedrails?: A Lot Help needed moving to and from a bed to a chair (including a wheelchair)?: A Little Help needed standing up from a chair using your arms (e.g., wheelchair or bedside chair)?: A Little Help needed to walk in hospital room?: A Little Help needed climbing 3-5 steps with a railing? : A Little 6 Click Score: 16    End of Session Equipment Utilized During Treatment: Gait belt Activity Tolerance: Patient tolerated treatment well;Patient limited by fatigue Patient left: in bed;with call bell/phone within reach;with bed alarm set Nurse Communication: Mobility status PT Visit Diagnosis: Difficulty in walking, not elsewhere classified (R26.2);Pain    Time: 2130-8657 PT Time Calculation (min) (ACUTE ONLY): 30 min   Charges:   PT Evaluation $PT Eval Low Complexity: 1 Low         Blondell Reveal Kistler PT 01/16/2020  Acute Rehabilitation Services Pager [phone] Office [phone]

## 2020-01-16 NOTE — Progress Notes (Signed)
PHARMACY - PHYSICIAN COMMUNICATION CRITICAL VALUE ALERT - BLOOD CULTURE IDENTIFICATION (BCID)  Albert Mayer is an 64 y.o. male who presented to [facility] on 01/12/2020 with a chief complaint of abdominal pain   Assessment:  severe sepsis secondary to intra-abdominal perforated appendicitis with abscess formation, POA  Name of physician (or Provider) Contacted: Lang Snow, FNP   Current antibiotics: Zosyn  Changes to prescribed antibiotics recommended:  Likely contaminant (GPR) - no need for additional abx   No results found for this or any previous visit.   Royetta Asal, PharmD, BCPS 01/16/2020 12:14 AM

## 2020-01-16 NOTE — Progress Notes (Signed)
Referring Physician(s): Gross,S  Supervising Physician: Jacqulynn Cadet  Patient Status:  [facility] - In-pt  Chief Complaint:  Abdominal pain, nausea, periappendiceal abscess  Subjective: Pt sleepy but arousable; denies worsening abd pain or vomiting; does have some intermittent nausea; has eaten today   Allergies: Codeine  Medications: Prior to Admission medications   Medication Sig Start Date End Date Taking? Authorizing Provider  acetaminophen (TYLENOL) 500 MG tablet Take 500 mg by mouth every 6 (six) hours as needed for mild pain or headache.   Yes [provider]  amLODipine (NORVASC) 5 MG tablet Take 5 mg by mouth daily.   Yes [provider]  aspirin EC 81 MG tablet Take 81 mg by mouth daily.   Yes [provider]  atorvastatin (LIPITOR) 40 MG tablet Take 40 mg by mouth daily. 11/24/19  Yes [provider]  glipiZIDE (GLUCOTROL) 10 MG tablet Take 10 mg by mouth daily before breakfast.   Yes [provider]  lisinopril-hydrochlorothiazide (PRINZIDE,ZESTORETIC) 20-12.5 MG tablet Take 1 tablet by mouth daily.   Yes [provider]  LORazepam (ATIVAN) 1 MG tablet Take 1 mg by mouth every 8 (eight) hours as needed for anxiety.  11/29/19  Yes [provider]  metFORMIN (GLUCOPHAGE-XR) 500 MG 24 hr tablet Take 500 mg by mouth 2 (two) times daily. 08/29/19  Yes [provider]  methocarbamol (ROBAXIN) 500 MG tablet Take 500 mg by mouth every 6 (six) hours as needed for muscle spasms.  11/29/19  Yes [provider]     Vital Signs: BP 130/61 (BP Location: Right Arm)    Pulse 87    Temp 98 F (36.7 C) (Oral)    Resp 16    Ht 6\' 2"  (1.88 m)    Wt 280 lb (127 kg)    SpO2 98%    BMI 35.95 kg/m   Physical Exam drowsy but arousable; RLQ drain intact, insertion site ok, sl tender, OP 25 cc turbid blood-tinged fluid; drain flushed without difficulty  Imaging: CT IMAGE GUIDED DRAINAGE BY PERCUTANEOUS  CATHETER  Result Date: 01/14/2020 INDICATION: 64 year old male with right lower quadrant intra-abdominal abscess favored to represent perforated appendicitis. He presents for percutaneous drain placement. EXAM: CT abscess drain placement MEDICATIONS: The patient is currently admitted to the hospital and receiving intravenous antibiotics. The antibiotics were administered within an appropriate time frame prior to the initiation of the procedure. ANESTHESIA/SEDATION: Fentanyl 25 mcg IV; Versed 0.5 mg IV Moderate Sedation Time:  12 minutes The patient was continuously monitored during the procedure by the interventional radiology nurse under my direct supervision. COMPLICATIONS: None immediate. PROCEDURE: Informed written consent was obtained from the patient after a thorough discussion of the procedural risks, benefits and alternatives. All questions were addressed. Maximal Sterile Barrier Technique was utilized including caps, mask, sterile gowns, sterile gloves, sterile drape, hand hygiene and skin antiseptic. A timeout was performed prior to the initiation of the procedure. A planning axial CT scan was performed with the patient in the supine window. There is a suboptimal window into the abscess cavity. Therefore, the patient was manipulated into the left lateral decubitus position. Repeat CT imaging was performed identifying a safe access into the right lower quadrant abscess. The skin was sterilely prepped and draped in the standard fashion using chlorhexidine skin prep. Local anesthesia was attained by infiltration with 1% lidocaine. A small dermatotomy was made. Under intermittent CT guidance, an 18 gauge trocar needle was advanced into the fluid collection. A 0.035 wire  was coiled in the fluid collection. The skin tract was then dilated to 12 [place]. A 12 French all-purpose drainage catheter was advanced over the wire and formed in the fluid collection. Aspiration yields approximately 60 mL foul-smelling  purulent fluid. Samples were sent for Gram stain and culture. The drainage catheter was flushed and connected to JP bulb suction before being secured to the skin with 0 Prolene suture. Post placement CT imaging demonstrates a well-positioned drainage catheter and no evidence of immediate complication. IMPRESSION: Successful placement of a 12 French drainage catheter into the right lower quadrant abscess. Electronically Signed   By: Jacqulynn Cadet M.D.   On: 01/14/2020 08:47    Labs:  CBC: Recent Labs    01/13/20 0300 01/14/20 0500 01/15/20 0500 01/16/20 0547  WBC 15.2* 11.7* 11.1* 8.5  HGB 12.3* 13.4 12.3* 12.9*  HCT 35.9* 39.2 35.7* 38.0*  PLT 254 228 237 257    COAGS: Recent Labs    01/12/20 1456 01/13/20 0300  INR 1.3* 1.3*    BMP: Recent Labs    01/13/20 0300 01/14/20 0500 01/15/20 0500 01/16/20 0547  NA 137 137 140 140  K 3.5 4.2 3.9 4.0  CL 101 105 105 103  CO2 23 24 25 28   GLUCOSE 121* 207* 162* 141*  BUN 36* 31* 23 15  CALCIUM 7.9* 7.7* 8.0* 8.4*  CREATININE 1.16 0.85 0.80 0.78  GFRNONAA >60 >60 >60 >60  GFRAA >60 >60 >60 >60    LIVER FUNCTION TESTS: Recent Labs    01/13/20 0300 01/14/20 0500 01/15/20 0500 01/16/20 0547  BILITOT 2.4* 1.8* 1.6* 1.4*  AST 49* 57* 44* 37  ALT 46* 51* 49* 46*  ALKPHOS 79 111 104 102  PROT 5.8* 5.9* 5.7* 5.8*  ALBUMIN 2.6* 2.6* 2.4* 2.5*    Assessment and Plan: Patient with history of right lower quadrant pain/ perforated appendicitis with associated abscess; s/p drain placement on 8/21; WBC nl;  hemoglobin stable;  drain fluid/blood cx pend; check fluid cytology; continue drain irrigation/output monitoring; once output less than 10 to 15 cc/day for 2-3 consecutive days or if clinical status worsens obtain follow-up CT; additional plans as per [facility] and [facility]   Electronically Signed: D. Rowe Robert, PA-C 01/16/2020, 3:05 PM   I spent a total of 15 minutes at the the patient's bedside AND on the patient's hospital  floor or unit, greater than 50% of which was counseling/coordinating care for right lower abdominal fluid collection drain    Patient ID: Albert Mayer, male   DOB: [birth year], 64 y.o.   MRN: [id number]

## 2020-01-16 NOTE — Care Management Important Message (Signed)
Important Message  Patient Details IM Letter given to the Patient Name: Albert Mayer MRN: [id number] Date of Birth: [birth year]   Medicare Important Message Given:  Yes     Kerin Salen 01/16/2020, 11:31 AM

## 2020-01-16 NOTE — Progress Notes (Signed)
Initial Nutrition Assessment  RD working remotely.  DOCUMENTATION CODES:   Obesity unspecified  INTERVENTION:  - will order [employer] 1.4 po once/day, each supplement provides 455 kcal and 20 grams protein. - will order 30 mL Prosource Plus BID, each supplement provides 100 kcal and 15 grams of protein. - will order 1 tablet multivitamin with mineral/day. - diet advancement as medically feasible.   NUTRITION DIAGNOSIS:   Increased nutrient needs related to acute illness as evidenced by estimated needs.  GOAL:   Patient will meet greater than or equal to 90% of their needs  MONITOR:   PO intake, Supplement acceptance, Labs, Weight trends  REASON FOR ASSESSMENT:   Malnutrition Screening Tool  ASSESSMENT:   64 year old male with medical history of non-insulin-dependent type 2 DM, HTN, HLD, chronic back pain, and obesity. He presented to the ED with a 2-week hx of worsening abdominal pain, fever, chills, fatigue, and N/V. CT abdomen/pelvis in the ED concerning for acute perforated appendicitis with abscess formation. General Surgery was consulted and recommended IR for drain placement.  Diet advanced from NPO to CLD on 8/22 at 69 and to [place] today at 0836. No intakes documented since diet advancement began.   Weight on 8/20 was 280 lb and PTA the most recently documented weight was on 01/28/16 when he weighed 284 lb.   Per notes: - some ongoing abdominal pain (R>L) - mild nausea with PO intakes but not requiring any antiemetics - severe sepsis on admission 2/2 perforated appendicitis and abscess s/p RLQ drain placement by IR on 8/21 - debility/deconditioning - plan for time of d/c is for home   Labs reviewed; HgbA1c: 8.1%, CBGs: 152 and 225 mg/dl, Ca: 8.4 mg/dl. Medications reviewed; sliding scale novolog, 40 mg IV protonix/day, 1 tablet senokot BID.     NUTRITION - FOCUSED PHYSICAL EXAM:  unable to complete at this time.   Diet Order:   Diet Order            Diet  full liquid Room service appropriate? Yes; Fluid consistency: Thin  Diet effective now                 EDUCATION NEEDS:   Not appropriate for education at this time  Skin:  Skin Assessment: Reviewed RN Assessment  Last BM:  8/24  Height:   Ht Readings from Last 1 Encounters:  01/12/20 6\' 2"  (1.88 m)    Weight:   Wt Readings from Last 1 Encounters:  01/12/20 127 kg    Estimated Nutritional Needs:  Kcal:  1905-2160 kcal Protein:  95-105 grams Fluid:  >/= 2.3 L/day     Jarome Matin, MS, RD, LDN, CNSC Inpatient Clinical Dietitian RD pager # available in [facility]  After hours/weekend pager # available in [facility]

## 2020-01-16 NOTE — Progress Notes (Signed)
° ° °  CC: Abdominal pain  Subjective: Patient is sitting up on the side of the bed today.  He feels a little bit better.  He continues to have generalized pain more severe on the right and the right lower quadrant than on the left.  He is continuing to have some nausea with clears but has not required any antiemetic.  His drain remains a cloudy serosanguineous fluid with some brown-colored fluid in the tubing.  Objective: Vital signs in last 24 hours: Temp:  [98.4 F (36.9 C)-98.7 F (37.1 C)] 98.5 F (36.9 C) (08/24 0541) Pulse Rate:  [59-77] 59 (08/24 0541) Resp:  [13-23] 19 (08/24 0541) BP: (137-179)/(66-93) 152/71 (08/24 0541) SpO2:  [93 %-98 %] 95 % (08/24 0541)  100 IV recorded, no other intake recorded Urine x3 Drain 25  Afebrile, vital signs are stable Glucose 141, bilirubin 1.4 WBC 8.5 Procalcitonin 75.3 >> 35.39 >>19.99>> 11.20 Intake/Output from previous day: 08/23 0701 - 08/24 0700 In: 100 [IV Piggyback:100] Out: 25 [Drains:25] Intake/Output this shift: No intake/output data recorded.  General appearance: alert, cooperative and no distress Resp: clear to auscultation bilaterally GI: He continues to have some generalized abdominal discomfort on palpation both left and right side.  Right upper and right lower quadrant soreness the most tender on palpation.  Few bowel sounds,  Lab Results:  Recent Labs    01/15/20 0500 01/16/20 0547  WBC 11.1* 8.5  HGB 12.3* 12.9*  HCT 35.7* 38.0*  PLT 237 257    BMET Recent Labs    01/15/20 0500 01/16/20 0547  NA 140 140  K 3.9 4.0  CL 105 103  CO2 25 28  GLUCOSE 162* 141*  BUN 23 15  CREATININE 0.80 0.78  CALCIUM 8.0* 8.4*   PT/INR No results for input(s): LABPROT, INR in the last 72 hours.  Recent Labs  Lab 01/12/20 1209 01/13/20 0300 01/14/20 0500 01/15/20 0500 01/16/20 0547  AST 42* 49* 57* 44* 37  ALT 51* 46* 51* 49* 46*  ALKPHOS 90 79 111 104 102  BILITOT 2.0* 2.4* 1.8* 1.6* 1.4*  PROT 7.0 5.8*  5.9* 5.7* 5.8*  ALBUMIN 3.4* 2.6* 2.6* 2.4* 2.5*     Lipase  No results found for: LIPASE   Medications:  acetaminophen  1,000 mg Oral Q8H   amLODipine  10 mg Oral Daily   feeding supplement (GLUCERNA SHAKE)  237 mL Oral TID BM   insulin aspart  0-15 Units Subcutaneous TID WC   lip balm  1 application Topical BID   pantoprazole (PROTONIX) IV  40 mg Intravenous Q24H   senna-docusate  1 tablet Oral BID    Assessment/Plan Sepsis  -  WBC 15.2>>11.1>>11.7(8/23)>> 8.5  - Procalcitonin 75.3 >> 35.39 >>19.99>> 11.20 Degenerative disc disease Type II diabetes Hypertension BMI 35.95  Acute appendicitis with perforation IR drain 8/21  FEN: IV fluids/clear liquids ID: Zosyn 8/20  >> day 5 DVT: SCD/may have chemical prophylaxis from our standpoint Follow up:  Dr.Gross  Plan: Continue IV antibiotics advance him to full liquids, and see how he does.  WBC and pro calcitonin both improving.    LOS: 4 days    Tomicka Lover 01/16/2020 Please see Amion

## 2020-01-17 LAB — COMPREHENSIVE METABOLIC PANEL
ALT: 44 U/L (ref 0–44)
AST: 30 U/L (ref 15–41)
Albumin: 3 g/dL — ABNORMAL LOW (ref 3.5–5.0)
Alkaline Phosphatase: 106 U/L (ref 38–126)
Anion gap: 12 (ref 5–15)
BUN: 11 mg/dL (ref 8–23)
CO2: 27 mmol/L (ref 22–32)
Calcium: 8.8 mg/dL — ABNORMAL LOW (ref 8.9–10.3)
Chloride: 101 mmol/L (ref 98–111)
Creatinine, Ser: 0.8 mg/dL (ref 0.61–1.24)
GFR calc Af Amer: >60 mL/min (ref >60)
GFR calc non Af Amer: >60 mL/min (ref >60)
Glucose, Bld: 138 mg/dL — ABNORMAL HIGH (ref 70–99)
Potassium: 3.8 mmol/L (ref 3.5–5.1)
Sodium: 140 mmol/L (ref 135–145)
Total Bilirubin: 1.1 mg/dL (ref 0.3–1.2)
Total Protein: 6.8 g/dL (ref 6.5–8.1)

## 2020-01-17 LAB — CBC WITH DIFFERENTIAL/PLATELET
Abs Immature Granulocytes: 0.43 10*3/uL — ABNORMAL HIGH (ref 0.00–0.07)
Basophils Absolute: 0.1 10*3/uL (ref 0.0–0.1)
Basophils Relative: 1 %
Eosinophils Absolute: 0.2 10*3/uL (ref 0.0–0.5)
Eosinophils Relative: 2 %
HCT: 42 % (ref 39.0–52.0)
Hemoglobin: 14.1 g/dL (ref 13.0–17.0)
Immature Granulocytes: 4 %
Lymphocytes Relative: 18 %
Lymphs Abs: 1.9 10*3/uL (ref 0.7–4.0)
MCH: 30.1 pg (ref 26.0–34.0)
MCHC: 33.6 g/dL (ref 30.0–36.0)
MCV: 89.7 fL (ref 80.0–100.0)
Monocytes Absolute: 1 10*3/uL (ref 0.1–1.0)
Monocytes Relative: 9 %
Neutro Abs: 7.1 10*3/uL (ref 1.7–7.7)
Neutrophils Relative %: 66 %
Platelets: 355 10*3/uL (ref 150–400)
RBC: 4.68 MIL/uL (ref 4.22–5.81)
RDW: 12.9 % (ref 11.5–15.5)
WBC: 10.8 10*3/uL — ABNORMAL HIGH (ref 4.0–10.5)
nRBC: 0 % (ref 0.0–0.2)

## 2020-01-17 LAB — MAGNESIUM: Magnesium: 1.8 mg/dL (ref 1.7–2.4)

## 2020-01-17 LAB — GLUCOSE, CAPILLARY
Glucose-Capillary: 133 mg/dL — ABNORMAL HIGH (ref 70–99)
Glucose-Capillary: 201 mg/dL — ABNORMAL HIGH (ref 70–99)
Glucose-Capillary: 163 mg/dL — ABNORMAL HIGH (ref 70–99)
Glucose-Capillary: 202 mg/dL — ABNORMAL HIGH (ref 70–99)

## 2020-01-17 LAB — PROCALCITONIN: Procalcitonin: 5.89 ng/mL

## 2020-01-17 MED ORDER — METHOCARBAMOL 500 MG PO TABS: 1000.0000 mg | ORAL_TABLET | Freq: Four times a day (QID) | ORAL | Status: DC | PRN

## 2020-01-17 MED ORDER — OXYCODONE HCL 5 MG PO TABS: 5.0000 mg | ORAL_TABLET | ORAL | Status: DC | PRN

## 2020-01-17 MED ORDER — HYDROMORPHONE HCL 1 MG/ML IJ SOLN: 0.5000 mg | INTRAMUSCULAR | Status: DC | PRN

## 2020-01-17 MED ORDER — SACCHAROMYCES BOULARDII 250 MG PO CAPS
250.0000 mg | ORAL_CAPSULE | Freq: Two times a day (BID) | ORAL | Status: DC
  Administered 2020-01-17 – 2020-01-18 (×3): 250 mg via ORAL
  Filled 2020-01-17 (×3): qty 1

## 2020-01-17 NOTE — Progress Notes (Signed)
Pt diet advanced to heart healthy and patient ate about 50% of lunch (cooked carrots, baked chicken, sweet potato). Pt called RN for nausea medicine and stated he wishes he was able to eat before being discharged. MD notified and states d/c will be reassessed tomorrow.

## 2020-01-17 NOTE — Progress Notes (Signed)
PROGRESS NOTE   Tagen Heslin  MRN:[id number]    DOB: 02/12/1956    DOA: 01/12/2020  PCP: Wilson, Fred H, MD   I have briefly reviewed patients previous medical records in [facility] [place] [facility].  Chief Complaint  Patient presents with  . Abdominal Pain    Brief Narrative:  64-year-old male with PMH of type II DM, HTN, HLD, chronic back pain, obesity, presented with 2-week history of worsening abdominal pain, fever, chills, fatigue, nausea and vomiting.  He was diagnosed with perforated appendicitis with associated abscess.  As per general surgery recommendations, IR placed percutaneous drain on 8/21.  Gradually improving.  Significant nausea this afternoon after advancing diet from full liquids to soft diet and hence to remain in-house overnight for close monitoring prior to safe discharge home, possibly 8/26.  Assessment & Plan:  Principal Problem:   Acute appendicitis with perforation and peritoneal abscess Active Problems:   Degeneration of lumbar intervertebral disc   Type 2 diabetes mellitus (HCC)   Primary hypertension   Hyperglycemia   Severe obesity (BMI 35.0-35.9 with comorbidity) (HCC)   Hypokalemia   Sepsis (HCC)   Severe sepsis with acute organ dysfunction (HCC)   Severe sepsis due to acute appendicitis with perforation and abscess, POA: Met sepsis criteria on admission (tachycardia, fever, leukocytosis) with evidence of endorgan damage by elevated lactate 5.1, transaminitis and elevated procalcitonin levels.  As per general surgery recommendations, IR placed percutaneous drain in right lower quadrant abscess on 8/21.  IV Zosyn 8/19 >.  Urine culture <10K colonies/insignificant growth.  Blood culture x1: Gram-positive rods in anaerobic bottle only-culture reintubated for better growth.  BCID however negative.  Abscess culture shows Streptococcus gordonii, resistant to erythromycin but sensitive to others including penicillin.  Diet advance from full liquids to soft at least today  with significant nausea and ate about 50% of meals.  Monitor closely to make sure that he is able to tolerate diet consistently prior to discharge.  At discharge transition to oral Augmentin to complete total 14 days course.  Patient and significant other to be educated regarding drain management PTA.  At discharge outpatient follow-up with general surgery and drain clinic.  I communicated with IR team at bedside and with CCS.  Type II DM with hyperglycemia: A1c 8.1 on 01/12/2020.  Holding home oral hypoglycemic medications.  Mildly uncontrolled and fluctuating on SSI.  Hypokalemia: Replaced.  Magnesium normal.  Mild hyponatremia: Likely related to dehydration in the setting of HCTZ use.  HCTZ discontinued.  Resolved.  Abnormal LFTs/mild transaminitis: Likely related to perforated acute appendicitis and sepsis.  Resolved.  Leukocytosis:  Secondary to sepsis.  Antimicrobial management as above.  Resolved.  Essential hypertension: Controlled on amlodipine 10 mg daily, continue.  Chronic back pain: No acute issues reported by patient.  Pulmonary nodules: Incidental finding.  Outpatient follow-up with PCP and repeat CT chest in 3 to 6 months.  Body mass index is 36.34 kg/m./Morbid obesity.  Deconditioning: Per therapy recommendation, home with PT and rolling walker with 5 inch wheels, 3 and 1.  Nutritional Status Nutrition Problem: Increased nutrient needs Etiology: acute illness Signs/Symptoms: estimated needs Interventions: Prostat, MVI, Other (Comment) ([place] 1.4 po)  DVT prophylaxis: SCDs Code Status: Full Family Communication: None at bedside Disposition:  Status is: Inpatient  Remains inpatient appropriate because:IV treatments appropriate due to intensity of illness or inability to take PO   Dispo: The patient is from: Home                Anticipated d/c is to: Home              Anticipated d/c date is: 1 day              Patient currently is not medically  stable to d/c.        Consultants:   General surgery Interventional radiology  Procedures:   CT-guided placement of 12 French gauge drainage catheter into right lower quadrant abscess by IR on 01/13/2020.  Antimicrobials:   IV Zosyn 8/19 greater than   Subjective:  Patient seen this morning.  Reports some right-sided abdominal pain but nothing as bad as on admission.  Had 2 BMs yesterday.  Tolerated full liquid diet and was looking forward to trying soft diet at lunch but subsequently RN reported that he ate about 50% and had severe nausea requiring antiemetics and patient apprehensive of going home today while unable to consistently take p.o.'s.  Objective:   Vitals:   01/16/20 2037 01/17/20 0500 01/17/20 0607 01/17/20 1256  BP: (!) 150/69  (!) 152/68 (!) 149/69  Pulse: 61  (!) 56 62  Resp: _0 Temp: 98.3 F (36.8 C)  98.1 F (36.7 C) 98.7 F (37.1 C)  TempSrc: Oral  Oral Oral  SpO2: 95%  95% 96%  Weight:  128.4 kg    Height:        General exam: 64-year-old male, moderately built and obese sitting up comfortably in reclining chair this morning. Respiratory system: Clear to auscultation. Respiratory effort normal. Cardiovascular system: S1 & S2 heard, RRR. No JVD, murmurs, rubs, gallops or clicks. No pedal edema.  Telemetry: SR-occasional sinus bradycardia in the 50s with occasional PVCs. Gastrointestinal system: Abdomen is nondistended/protuberant, soft and nontender. No organomegaly or masses felt. Normal bowel sounds heard.  RLQ drain intact with minimal bloodstained fluid in bulb. Central nervous system: Alert and oriented. No focal neurological deficits. Extremities: Symmetric 5 x 5 power. Skin: No rashes, lesions or ulcers Psychiatry: Judgement and insight appear normal. Mood & affect appropriate.     Data Reviewed:   I have personally reviewed following labs and imaging studies   CBC: Recent Labs  Lab 01/14/20 0500 01/14/20 0500  01/15/20 0500 01/16/20 0547 01/17/20 0512  WBC 11.7*   < > 11.1* 8.5 10.8*  NEUTROABS 8.9*  --  8.3*  --  7.1  HGB 13.4   < > 12.3* 12.9* 14.1  HCT 39.2   < > 35.7* 38.0* 42.0  MCV 89.3   < > 90.2 90.3 89.7  PLT 228   < > 237 257 355   < > = values in this interval not displayed.    Basic Metabolic Panel: Recent Labs  Lab 01/14/20 0500 01/14/20 0500 01/15/20 0500 01/16/20 0547 01/17/20 0512  NA 137   < > 140 140 140  K 4.2   < > 3.9 4.0 3.8  CL 105   < > 105 103 101  CO2 24   < > _1 GLUCOSE 207*   < > 162* 141* 138*  BUN 31*   < > _2 CREATININE 0.85   < > 0.80 0.78 0.80  CALCIUM 7.7*   < > 8.0* 8.4* 8.8*  MG 2.3   < > 1.9 1.9 1.8  PHOS 2.2*  --  2.6  --   --    < > = values in this interval not displayed.    Liver Function Tests: Recent Labs  Lab 01/15/20 0500 01/16/20 0547 01/17/20 0512  AST 44* 37 30  ALT 49* 46* 44  ALKPHOS 104 102 106  BILITOT 1.6* 1.4* 1.1  PROT 5.7* 5.8* 6.8  ALBUMIN 2.4* 2.5* 3.0*    CBG: Recent Labs  Lab 01/16/20 2039 01/17/20 0804 01/17/20 1130  GLUCAP 154* 133* 201*    Microbiology Studies:   Recent Results (from the past 240 hour(s))  Urine culture     Status: Abnormal   Collection Time: 01/12/20  2:06 PM   Specimen: Urine, Random  Result Value Ref Range Status   Specimen Description   Final    URINE, RANDOM Performed at [facility], [place] [address] [place], [place] [address]    Special Requests   Final    NONE Performed at [facility], [place] [address], [place], [place] [address]    Culture (A)  Final    <10,000 COLONIES/mL INSIGNIFICANT GROWTH Performed at [facility] [address] [place], [place] [address]    Report Status 01/14/2020 FINAL  Final  Blood culture (routine x 2)     Status: None (Preliminary result)   Collection Time: 01/12/20  2:33 PM   Specimen: BLOOD  Result Value Ref Range Status   Specimen Description   Final    BLOOD RIGHT  ANTECUBITAL Performed at [facility], [place] [address], [place], [place] [address]    Special Requests   Final    BOTTLES DRAWN AEROBIC AND ANAEROBIC Blood Culture results may not be optimal due to an excessive volume of blood received in culture bottles Performed at [facility] [address], [place], [place] [address]    Culture   Final    HOLDING FOR POSSIBLE ANAEROBE Performed at [facility], [place] [address] [place], [place] [address]    Report Status PENDING  Incomplete  Blood culture (routine x 2)     Status: None (Preliminary result)   Collection Time: 01/12/20  2:38 PM   Specimen: BLOOD  Result Value Ref Range Status   Specimen Description   Final    BLOOD LEFT WRIST Performed at [facility] [address], [place], [place] [address]    Special Requests   Final    BOTTLES DRAWN AEROBIC AND ANAEROBIC Blood Culture adequate volume Performed at [facility] [address] [place], [place] [address]    Culture  Setup Time   Final    GRAM POSITIVE RODS ANAEROBIC BOTTLE ONLY CRITICAL RESULT CALLED TO, READ BACK BY AND VERIFIED WITH: N.GLOGOVAC _0  01/15/20 EB    Culture   Final    CULTURE REINCUBATED FOR BETTER GROWTH Performed at [facility], [place] [address] [place], [place] [address]    Report Status PENDING  Incomplete  Blood Culture ID Panel (Reflexed)     Status: None   Collection Time: 01/12/20  2:38 PM  Result Value Ref Range Status   Enterococcus faecalis NOT DETECTED NOT DETECTED Final   Enterococcus Faecium NOT DETECTED NOT DETECTED Final   Listeria monocytogenes NOT DETECTED NOT DETECTED Final   Staphylococcus species NOT DETECTED NOT DETECTED Final   Staphylococcus aureus (BCID) NOT DETECTED NOT DETECTED Final   Staphylococcus epidermidis NOT DETECTED NOT DETECTED Final   Staphylococcus lugdunensis NOT DETECTED NOT DETECTED Final   Streptococcus species NOT DETECTED NOT  DETECTED Final   Streptococcus agalactiae NOT DETECTED NOT DETECTED Final   Streptococcus pneumoniae NOT DETECTED  NOT DETECTED Final   Streptococcus pyogenes NOT DETECTED NOT DETECTED Final   A.calcoaceticus-baumannii NOT DETECTED NOT DETECTED Final   Bacteroides fragilis NOT DETECTED NOT DETECTED Final   Enterobacterales NOT DETECTED NOT DETECTED Final   Enterobacter cloacae complex NOT DETECTED NOT DETECTED Final   Escherichia coli NOT DETECTED NOT DETECTED Final   Klebsiella aerogenes NOT DETECTED NOT DETECTED Final   Klebsiella oxytoca NOT DETECTED NOT DETECTED Final   Klebsiella pneumoniae NOT DETECTED NOT DETECTED Final   Proteus species NOT DETECTED NOT DETECTED Final   Salmonella species NOT DETECTED NOT DETECTED Final   Serratia marcescens NOT DETECTED NOT DETECTED Final   Haemophilus influenzae NOT DETECTED NOT DETECTED Final   Neisseria meningitidis NOT DETECTED NOT DETECTED Final   Pseudomonas aeruginosa NOT DETECTED NOT DETECTED Final   Stenotrophomonas maltophilia NOT DETECTED NOT DETECTED Final   Candida albicans NOT DETECTED NOT DETECTED Final   Candida auris NOT DETECTED NOT DETECTED Final   Candida glabrata NOT DETECTED NOT DETECTED Final   Candida krusei NOT DETECTED NOT DETECTED Final   Candida parapsilosis NOT DETECTED NOT DETECTED Final   Candida tropicalis NOT DETECTED NOT DETECTED Final   Cryptococcus neoformans/gattii NOT DETECTED NOT DETECTED Final    Comment: Performed at [facility], [address] [place], [place] [address]  SARS Coronavirus 2 by RT PCR (hospital order, performed in [place] hospital lab) Nasopharyngeal Nasopharyngeal Swab     Status: None   Collection Time: 01/13/20  9:04 AM   Specimen: Nasopharyngeal Swab  Result Value Ref Range Status   SARS Coronavirus 2 NEGATIVE NEGATIVE Final    Comment: (NOTE) SARS-CoV-2 target nucleic acids are NOT DETECTED.  The SARS-CoV-2 RNA is generally detectable in upper and lower respiratory  specimens during the acute phase of infection. The lowest concentration of SARS-CoV-2 viral copies this assay can detect is 250 copies / mL. A negative result does not preclude SARS-CoV-2 infection and should not be used as the sole basis for treatment or other patient management decisions.  A negative result may occur with improper specimen collection / handling, submission of specimen other than nasopharyngeal swab, presence of viral mutation(s) within the areas targeted by this assay, and inadequate number of viral copies (<250 copies / mL). A negative result must be combined with clinical observations, patient history, and epidemiological information.  Fact Sheet for Patients:   https://www.fda.gov/media/136312/download  Fact Sheet for Healthcare Providers: https://www.fda.gov/media/136313/download  This test is not yet approved or  cleared by the [place] FDA and has been authorized for detection and/or diagnosis of SARS-CoV-2 by FDA under an Emergency Use Authorization (EUA).  This EUA will remain in effect (meaning this test can be used) for the duration of the COVID-19 declaration under Section 564(b)(1) of the Act, 21 U.S.C. section 360bbb-3(b)(1), unless the authorization is terminated or revoked sooner.  Performed at [facility], [address] [place], [place] [address]   Aerobic/Anaerobic Culture (surgical/deep wound)     Status: None (Preliminary result)   Collection Time: 01/13/20  3:55 PM   Specimen: Abscess  Result Value Ref Range Status   Specimen Description ABSCESS  Final   Special Requests NONE  Final   Gram Stain NO WBC SEEN RARE GRAM NEGATIVE RODS   Final   Culture   Final    MODERATE STREPTOCOCCUS GORDONII NO ANAEROBES ISOLATED; CULTURE IN PROGRESS FOR 5 DAYS    Report Status PENDING  Incomplete   Organism ID, Bacteria STREPTOCOCCUS GORDONII    Final      Susceptibility   Streptococcus gordonii - MIC*    PENICILLIN <=0.06  SENSITIVE Sensitive     CEFTRIAXONE <=0.12 SENSITIVE Sensitive     ERYTHROMYCIN 2 RESISTANT Resistant     LEVOFLOXACIN 1 SENSITIVE Sensitive     VANCOMYCIN Value in next row Sensitive      0.5 SENSITIVEPerformed at [facility], [address] [place], [place] [address]    * MODERATE STREPTOCOCCUS GORDONII     Radiology Studies:  No results found.   Scheduled Meds:   . (feeding supplement) PROSource Plus  30 mL Oral BID BM  . acetaminophen  1,000 mg Oral Q8H  . amLODipine  10 mg Oral Daily  . feeding supplement ([place] STANDARD 1.4)  325 mL Oral Daily  . insulin aspart  0-15 Units Subcutaneous TID WC  . lip balm  1 application Topical BID  . multivitamin with minerals  1 tablet Oral Daily  . pantoprazole (PROTONIX) IV  40 mg Intravenous Q24H  . saccharomyces boulardii  250 mg Oral BID  . senna-docusate  1 tablet Oral BID    Continuous Infusions:   . ondansetron (ZOFRAN) IV    . piperacillin-tazobactam (ZOSYN)  IV 3.375 g (01/17/20 1255)     LOS: 5 days     Anand Hongalgi, MD, FACP, SFHM. [facility]    To contact the attending provider between 7A-7P or the covering provider during after hours 7P-7A, please log into the web site [email or web address] and access using universal [place] password for that web site. If you do not have the password, please call the hospital operator.  01/17/2020, 2:40 PM   

## 2020-01-17 NOTE — Progress Notes (Signed)
[facility] Progress Note     Subjective: CC-  Up in chair. Overall feeling better. Abdominal pain improving. Mild nausea at times, no emesis. BM x2 yesterday. Tolerating full liquids. Worked with PT this AM. Ambulated in halls. WBC 10.8, afebrile. procalcitonin down.  Objective: Vital signs in last 24 hours: Temp:  [98 F (36.7 C)-98.4 F (36.9 C)] 98.1 F (36.7 C) (08/25 0607) Pulse Rate:  [56-87] 56 (08/25 0607) Resp:  [16-20] 18 (08/25 0607) BP: (130-152)/(61-71) 152/68 (08/25 0607) SpO2:  [95 %-98 %] 95 % (08/25 0607) Weight:  [128.4 kg] 128.4 kg (08/25 0500) Last BM Date: 01/16/20  Intake/Output from previous day: 08/24 0701 - 08/25 0700 In: 540.2 [P.O.:240; IV Piggyback:300.2] Out: 20 [Drains:20] Intake/Output this shift: No intake/output data recorded.  PE: Gen:  Alert, NAD, pleasant Pulm:  rate and effort normal Abd: Soft, mild distension, minimal TTP around drain, +BS, drain with cloudy/red tinged fluid in bulb >>20cc/24hr  Lab Results:  Recent Labs    01/16/20 0547 01/17/20 0512  WBC 8.5 10.8*  HGB 12.9* 14.1  HCT 38.0* 42.0  PLT 257 355   BMET Recent Labs    01/16/20 0547 01/17/20 0512  NA 140 140  K 4.0 3.8  CL 103 101  CO2 28 27  GLUCOSE 141* 138*  BUN 15 11  CREATININE 0.78 0.80  CALCIUM 8.4* 8.8*   PT/INR No results for input(s): LABPROT, INR in the last 72 hours. CMP     Component Value Date/Time   NA 140 01/17/2020 0512   K 3.8 01/17/2020 0512   CL 101 01/17/2020 0512   CO2 27 01/17/2020 0512   GLUCOSE 138 (H) 01/17/2020 0512   BUN 11 01/17/2020 0512   CREATININE 0.80 01/17/2020 0512   CALCIUM 8.8 (L) 01/17/2020 0512   PROT 6.8 01/17/2020 0512   ALBUMIN 3.0 (L) 01/17/2020 0512   AST 30 01/17/2020 0512   ALT 44 01/17/2020 0512   ALKPHOS 106 01/17/2020 0512   BILITOT 1.1 01/17/2020 0512   GFRNONAA >60 01/17/2020 0512   GFRAA >60 01/17/2020 0512   Lipase  No results found for:  LIPASE     Studies/Results: No results found.  Anti-infectives: Anti-infectives (From admission, onward)   Start     Dose/Rate Route Frequency Ordered Stop   01/12/20 2200  piperacillin-tazobactam (ZOSYN) IVPB 3.375 g        3.375 g 12.5 mL/hr over 240 Minutes Intravenous Every 8 hours 01/12/20 1459     01/12/20 1600  vancomycin (VANCOCIN) IVPB 1000 mg/200 mL premix  Status:  Discontinued        1,000 mg 200 mL/hr over 60 Minutes Intravenous Every 8 hours 01/12/20 1453 01/12/20 1543   01/12/20 1445  piperacillin-tazobactam (ZOSYN) IVPB 4.5 g  Status:  Discontinued        4.5 g 200 mL/hr over 30 Minutes Intravenous  Once 01/12/20 1434 01/12/20 1436   01/12/20 1445  piperacillin-tazobactam (ZOSYN) IVPB 3.375 g        3.375 g 100 mL/hr over 30 Minutes Intravenous  Once 01/12/20 1436 01/12/20 1618       Assessment/Plan Sepsis -  WBC 15.2>>11.1>>11.7(8/23)>> 8.5>> 10.8  - Procalcitonin 75.3 >> 35.39 >>19.99>> 11.20>> 5.89 Degenerative disc disease Type II diabetes Hypertension BMI 35.95  Acute appendicitis with perforation IR drain 8/21 >> culture pending  FEN: IV fluids, HH/CM diet ID: Zosyn 8/20 >>day#6 DVT: SCD/may have chemical prophylaxis from our standpoint Follow up: Dr.Gross  Plan: Advance to HH/CM diet. Continue  IV zosyn while admitted. Add probiotic. Will ask RN to teach patient how to flush drain. If he is tolerating diet and pain well controlled, ok for discharge later today vs tomorrow from surgical standpoint. Recommend transitioning to augmentin at discharge to complete 14 total days of antibiotics. I will arrange follow up in our office. He also needs to see IR drain clinic.   LOS: 5 days    [facility] 01/17/2020, 8:06 AM Please see Amion for pager number during day hours 7:00am-4:30pm

## 2020-01-17 NOTE — TOC Progression Note (Signed)
Transition of Care [facility]) - Progression Note    Patient Details  Name: Albert Mayer MRN: [id number] Date of Birth: [birth year]  Transition of Care [facility]) CM/SW Contact  Nature Vogelsang, Juliann Pulse, RN Phone Number: 01/17/2020, 12:08 PM  Clinical Narrative:  [facility] chosen by patient-they can provide-HHRN/PT [facility] Friday.They will get an address from patient since patient states he may stay with s/o in [place].[employer] rep Col[place]ta [place] to deliver rw to rm prior d/c.Has own transport home.     Expected Discharge Plan: [place] Barriers to Discharge: No Barriers Identified  Expected Discharge Plan and Services Expected Discharge Plan: [place]   Discharge Planning Services: CM Consult Post Acute Care Choice: [place] arrangements for the past 2 months: Single Family Home                 DME Arranged: Walker rolling (Patient declines 3n1) DME Agency: [employer] Date DME Agency Contacted: 01/17/20 Time DME Agency Contacted: [phone] Representative spoke with at DME Agency: [place]: RN, PT [facility] Agency: [place] ([place]) Date [place]: 01/17/20 Time [place]: [phone] Representative spoke with at [place]: [place] ([place]) Interventions    Readmission Risk Interventions No flowsheet data found.

## 2020-01-17 NOTE — Progress Notes (Signed)
Occupational Therapy Evaluation  Patient with functional deficits listed below impacting safety and independence with self care. Patient mod I with bed mobility with HOB elevated, supervision with min cues for hand placement to stand from EOB and supervision with functional ambulation using rolling walker into bathroom. Supervision for toilet transfer and independent with peri care. Patient unable to doff socks during session, initiate education regarding LB AE, patient reports he has a reacher at home but unfamiliar with sock aid. Will follow up acutely to address LB AE in order to maximize patient independence with ADLs.    01/17/20 0900  OT Visit Information  Last OT Received On 01/17/20  Assistance Needed +1  History of Present Illness 64 y.o. male with history of NIDDM-2, HTN, HLD, chronic back pain and obesity presenting with abdominal pain. Dx of appendicitis with perforation and peritoneal abscess. s/p IR drain 01/13/20.  Precautions  Precautions Fall  Precaution Comments abdominal drain  Restrictions  Weight Bearing Restrictions No  Home Living  Family/patient expects to be discharged to: Private residence  Living Arrangements Alone  Available Help at Discharge Friend(s);Family;Available PRN/intermittently  Type of Home House  Home Access Stairs to enter  Entrance Stairs-Number of Steps 4  Entrance Stairs-Rails None  Home Layout One level  Bathroom Financial risk analyst  Additional Comments patient reports s/o house has no STE and may stay with her at D/C  Prior Function  Level of Independence Independent  Communication  Communication No difficulties  Pain Assessment  Pain Assessment Faces  Faces Pain Scale 4  Pain Location abdomen, back  Pain Descriptors / Indicators Sore  Pain Intervention(s) Monitored during session  Cognition  Arousal/Alertness Awake/alert  Behavior  During Therapy WFL for tasks assessed/performed  Overall Cognitive Status Within Functional Limits for tasks assessed  Upper Extremity Assessment  Upper Extremity Assessment Overall WFL for tasks assessed  Lower Extremity Assessment  Lower Extremity Assessment Defer to PT evaluation  ADL  Overall ADL's  Needs assistance/impaired  Grooming Set up;Sitting  Upper Body Bathing Set up;Sitting  Lower Body Bathing Moderate assistance;Sitting/lateral leans;Sit to/from stand  Upper Body Dressing  Set up;Sitting  Lower Body Dressing Maximal assistance;Sitting/lateral leans;Sit to/from stand  Lower Body Dressing Details (indicate cue type and reason) patient require assist to don socks seated EOB, initiate education on LB AE  Toilet Transfer Supervision/safety;Ambulation;Regular Toilet;Grab bars;RW  Toileting- Clothing Manipulation and Hygiene Independent;Sitting/lateral lean  Functional mobility during ADLs Supervision/safety;Rolling walker  General ADL Comments patient demonstrates adequate mobility during functional ambulation and transfers at supervision level, requiring assist with LB ADLs due to abdomen pain and hx lower back issues.   Bed Mobility  Overal bed mobility Modified Independent  Bed Mobility Supine to Sit  Supine to sit Modified independent (Device/Increase time);HOB elevated  General bed mobility comments patient semi rolls to his side with use of grab bar and is able to upright trunk with HOB elevated   Transfers  Overall transfer level Needs assistance  Equipment used Rolling walker (2 wheeled)  Transfers Sit to/from Stand  Sit to Stand Supervision  General transfer comment min cues for hand placement initially   Balance  Overall balance assessment Needs assistance  Sitting-balance support Feet supported  Sitting balance-Leahy Scale Fair  Standing balance support Bilateral upper extremity supported  Standing balance-Leahy Scale Fair  Standing balance comment patient  prefer ambulation with walker, however able to stand statically EOB without UE support  OT -  End of Session  Equipment Utilized During Treatment Rolling walker  Activity Tolerance Patient tolerated treatment well  Patient left in chair;with call bell/phone within reach;with nursing/sitter in room  Nurse Communication Mobility status  OT Assessment  OT Recommendation/Assessment Patient needs continued OT Services  OT Visit Diagnosis Pain  Pain - part of body  (abdomen)  OT Problem List Decreased activity tolerance;Pain;Obesity;Decreased knowledge of use of DME or AE  OT Plan  OT Frequency (ACUTE ONLY) Min 2X/week  OT Treatment/Interventions (ACUTE ONLY) Self-care/ADL training;DME and/or AE instruction;Energy conservation;Patient/family education;Therapeutic activities  AM-PAC OT "6 Clicks" Daily Activity Outcome Measure (Version 2)  Help from another person eating meals? 4  Help from another person taking care of personal grooming? 3  Help from another person toileting, which includes using toliet, bedpan, or urinal? 3  Help from another person bathing (including washing, rinsing, drying)? 2  Help from another person to put on and taking off regular upper body clothing? 3  Help from another person to put on and taking off regular lower body clothing? 2  6 Click Score 17  OT Recommendation  Follow Up Recommendations No OT follow up  OT Equipment Other (comment) (sock aid)  Individuals Consulted  Consulted and Agree with Results and Recommendations Patient  Acute Rehab OT Goals  Patient Stated Goal yardwork, fishing, get strength back  OT Goal Formulation With patient  Time For Goal Achievement 01/31/20  Potential to Achieve Goals Good  OT Time Calculation  OT Start Time (ACUTE ONLY) 0740  OT Stop Time (ACUTE ONLY) 0803  OT Time Calculation (min) 23 min  OT General Charges  $OT Visit 1 Visit  OT Evaluation  $OT Eval Low Complexity 1 Low  OT Treatments  $Self Care/Home  Management  8-22 mins  Written Expression  Dominant Hand  (did not specify)   Delbert Phenix OT OT pager: [phone]

## 2020-01-17 NOTE — Progress Notes (Signed)
Referring Physician(s): Gross,S  Supervising Physician: Markus Daft  Patient Status:  [facility] - In-pt  Chief Complaint: Abdominal pain, nausea, periappendiceal abscess   Subjective: Pt doing fairly well; has some mild RLQ soreness at drain site and intermittent nausea   Allergies: Codeine  Medications: Prior to Admission medications   Medication Sig Start Date End Date Taking? Authorizing Provider  acetaminophen (TYLENOL) 500 MG tablet Take 500 mg by mouth every 6 (six) hours as needed for mild pain or headache.   Yes [provider]  amLODipine (NORVASC) 5 MG tablet Take 5 mg by mouth daily.   Yes [provider]  aspirin EC 81 MG tablet Take 81 mg by mouth daily.   Yes [provider]  atorvastatin (LIPITOR) 40 MG tablet Take 40 mg by mouth daily. 11/24/19  Yes [provider]  glipiZIDE (GLUCOTROL) 10 MG tablet Take 10 mg by mouth daily before breakfast.   Yes [provider]  lisinopril-hydrochlorothiazide (PRINZIDE,ZESTORETIC) 20-12.5 MG tablet Take 1 tablet by mouth daily.   Yes [provider]  LORazepam (ATIVAN) 1 MG tablet Take 1 mg by mouth every 8 (eight) hours as needed for anxiety.  11/29/19  Yes [provider]  metFORMIN (GLUCOPHAGE-XR) 500 MG 24 hr tablet Take 500 mg by mouth 2 (two) times daily. 08/29/19  Yes [provider]  methocarbamol (ROBAXIN) 500 MG tablet Take 500 mg by mouth every 6 (six) hours as needed for muscle spasms.  11/29/19  Yes [provider]     Vital Signs: BP (!) 152/68 (BP Location: Right Arm)   Pulse (!) 56   Temp 98.1 F (36.7 C) (Oral)   Resp 18   Ht 6\' 2"  (1.88 m)   Wt 283 lb 1.1 oz (128.4 kg)   SpO2 95%   BMI 36.34 kg/m   Physical Exam awake/alert; RLQ drain intact, insertion site ok, mildly tender, OP 20 cc reddish brown fluid, drain flushed without difficulty  Imaging: CT IMAGE GUIDED DRAINAGE BY PERCUTANEOUS CATHETER  Result Date:  01/14/2020 INDICATION: 64 year old male with right lower quadrant intra-abdominal abscess favored to represent perforated appendicitis. He presents for percutaneous drain placement. EXAM: CT abscess drain placement MEDICATIONS: The patient is currently admitted to the hospital and receiving intravenous antibiotics. The antibiotics were administered within an appropriate time frame prior to the initiation of the procedure. ANESTHESIA/SEDATION: Fentanyl 25 mcg IV; Versed 0.5 mg IV Moderate Sedation Time:  12 minutes The patient was continuously monitored during the procedure by the interventional radiology nurse under my direct supervision. COMPLICATIONS: None immediate. PROCEDURE: Informed written consent was obtained from the patient after a thorough discussion of the procedural risks, benefits and alternatives. All questions were addressed. Maximal Sterile Barrier Technique was utilized including caps, mask, sterile gowns, sterile gloves, sterile drape, hand hygiene and skin antiseptic. A timeout was performed prior to the initiation of the procedure. A planning axial CT scan was performed with the patient in the supine window. There is a suboptimal window into the abscess cavity. Therefore, the patient was manipulated into the left lateral decubitus position. Repeat CT imaging was performed identifying a safe access into the right lower quadrant abscess. The skin was sterilely prepped and draped in the standard fashion using chlorhexidine skin prep. Local anesthesia was attained by infiltration with 1% lidocaine. A small dermatotomy was made. Under intermittent CT guidance, an 18 gauge trocar needle was advanced into the fluid collection. A 0.035 wire was coiled in the fluid collection. The skin  tract was then dilated to 12 [place]. A 12 French all-purpose drainage catheter was advanced over the wire and formed in the fluid collection. Aspiration yields approximately 60 mL foul-smelling purulent fluid. Samples were  sent for Gram stain and culture. The drainage catheter was flushed and connected to JP bulb suction before being secured to the skin with 0 Prolene suture. Post placement CT imaging demonstrates a well-positioned drainage catheter and no evidence of immediate complication. IMPRESSION: Successful placement of a 12 French drainage catheter into the right lower quadrant abscess. Electronically Signed   By: Jacqulynn Cadet M.D.   On: 01/14/2020 08:47    Labs:  CBC: Recent Labs    01/14/20 0500 01/15/20 0500 01/16/20 0547 01/17/20 0512  WBC 11.7* 11.1* 8.5 10.8*  HGB 13.4 12.3* 12.9* 14.1  HCT 39.2 35.7* 38.0* 42.0  PLT 228 237 257 355    COAGS: Recent Labs    01/12/20 1456 01/13/20 0300  INR 1.3* 1.3*    BMP: Recent Labs    01/14/20 0500 01/15/20 0500 01/16/20 0547 01/17/20 0512  NA 137 140 140 140  K 4.2 3.9 4.0 3.8  CL 105 105 103 101  CO2 24 25 28 27   GLUCOSE 207* 162* 141* 138*  BUN 31* 23 15 11   CALCIUM 7.7* 8.0* 8.4* 8.8*  CREATININE 0.85 0.80 0.78 0.80  GFRNONAA >60 >60 >60 >60  GFRAA >60 >60 >60 >60    LIVER FUNCTION TESTS: Recent Labs    01/14/20 0500 01/15/20 0500 01/16/20 0547 01/17/20 0512  BILITOT 1.8* 1.6* 1.4* 1.1  AST 57* 44* 37 30  ALT 51* 49* 46* 44  ALKPHOS 111 104 102 106  PROT 5.9* 5.7* 5.8* 6.8  ALBUMIN 2.6* 2.4* 2.5* 3.0*    Assessment and Plan: Patient with history of right lower quadrant pain/perforatedappendicitis with associated abscess; s/pdrain placement on 8/21;afebrile; WBC 10.8, hgb nl; creat nl; drain fluid cx- strept gordonii; cont current tx; as OP rec once daily flushing of drain with 5 cc sterile NS, output recording and dressing changes every 1-2 days; will schedule for f/u in IR drain clinic; pt given prescription for saline flushes as well as output recording card with IR drain clinic phone number if questions arise.  Patient/significant other will be instructed on how to flush drain later this afternoon by  nursing.   Electronically Signed: D. Rowe Robert, PA-C 01/17/2020, 11:30 AM   I spent a total of 15 minutes at the the patient's bedside AND on the patient's hospital floor or unit, greater than 50% of which was counseling/coordinating care for right lower abdominal abscess drain    Patient ID: Albert Mayer, male   DOB: 1956-05-11, 64 y.o.   MRN: [id number]

## 2020-01-18 LAB — CULTURE, BLOOD (ROUTINE X 2)

## 2020-01-18 LAB — GLUCOSE, CAPILLARY
Glucose-Capillary: 124 mg/dL — ABNORMAL HIGH (ref 70–99)
Glucose-Capillary: 152 mg/dL — ABNORMAL HIGH (ref 70–99)

## 2020-01-18 LAB — PROCALCITONIN: Procalcitonin: 3.31 ng/mL

## 2020-01-18 MED ORDER — SACCHAROMYCES BOULARDII 250 MG PO CAPS: 250.0000 mg | ORAL_CAPSULE | Freq: Two times a day (BID) | ORAL | 0 refills | Status: DC

## 2020-01-18 MED ORDER — PANTOPRAZOLE SODIUM 40 MG PO TBEC: 40.0000 mg | DELAYED_RELEASE_TABLET | Freq: Every day | ORAL | 0 refills | Status: AC

## 2020-01-18 MED ORDER — PANTOPRAZOLE SODIUM 40 MG PO TBEC: 40.0000 mg | DELAYED_RELEASE_TABLET | Freq: Every day | ORAL | 0 refills | Status: DC

## 2020-01-18 MED ORDER — SENNOSIDES-DOCUSATE SODIUM 8.6-50 MG PO TABS: 1.0000 | ORAL_TABLET | Freq: Two times a day (BID) | ORAL | 0 refills | Status: DC

## 2020-01-18 MED ORDER — AMOXICILLIN-POT CLAVULANATE 875-125 MG PO TABS: 1.0000 | ORAL_TABLET | Freq: Two times a day (BID) | ORAL | 0 refills | Status: AC

## 2020-01-18 MED ORDER — NORMAL SALINE FLUSH 0.9 % IV SOLN: INTRAVENOUS | 0 refills | Status: DC

## 2020-01-18 MED ORDER — SACCHAROMYCES BOULARDII 250 MG PO CAPS: 250.0000 mg | ORAL_CAPSULE | Freq: Two times a day (BID) | ORAL | 0 refills | Status: AC

## 2020-01-18 MED ORDER — SENNOSIDES-DOCUSATE SODIUM 8.6-50 MG PO TABS: 1.0000 | ORAL_TABLET | Freq: Two times a day (BID) | ORAL | 0 refills | Status: AC

## 2020-01-18 MED ORDER — AMOXICILLIN-POT CLAVULANATE 875-125 MG PO TABS: 1.0000 | ORAL_TABLET | Freq: Two times a day (BID) | ORAL | 0 refills | Status: DC

## 2020-01-18 MED ORDER — ACETAMINOPHEN 500 MG PO TABS: 500.0000 mg | ORAL_TABLET | Freq: Three times a day (TID) | ORAL | Status: AC | PRN

## 2020-01-18 MED ORDER — ADULT MULTIVITAMIN W/MINERALS CH: 1.0000 | ORAL_TABLET | Freq: Every day | ORAL | Status: AC

## 2020-01-18 NOTE — TOC Transition Note (Signed)
Transition of Care [facility]) - CM/SW Discharge Note   Patient Details  Name: Albert Mayer MRN: [id number] Date of Birth: 1955-08-10  [facility] CM/SW Contact:  Dessa Phi, RN Phone Number: 01/18/2020, 12:14 PM   Clinical Narrative: d/c home.see notes below. No further Cm needs.      Final next level of care: [place] Barriers to Discharge: No Barriers Identified   Patient Goals and CMS Choice Patient states their goals for this hospitalization and ongoing recovery are:: go home CMS Medicare.gov Compare Post Acute Care list provided to:: Patient    Discharge Placement                       Discharge Plan and Services   Discharge Planning Services: CM Consult Post Acute Care Choice: Home Health          DME Arranged: Walker rolling (Patient declines 3n1) DME Agency: [employer] Date DME Agency Contacted: 01/17/20 Time DME Agency Contacted: [phone] Representative spoke with at DME Agency: [place]: RN, PT [facility] Agency: [place] ([place]) Date [place]: 01/17/20 Time [place]: [id number] Representative spoke with at [place]: [place] ([place]) Interventions     Readmission Risk Interventions No flowsheet data found.

## 2020-01-18 NOTE — Discharge Summary (Signed)
Physician Discharge Summary  Albert Mayer HCW:[id number] DOB: [birth year]  PCP: Christain Sacramento, MD  Admitted from: Home Discharged to: Home  Admit date: 01/12/2020 Discharge date: 01/18/2020  Recommendations for Outpatient Follow-up:    Follow-up Information    Michael Boston, MD. Go on 02/05/2020.   Specialty: General Surgery Why: Your appointment is 9/13, arrive at 2:45pm to check in and fill out paperwork for a 3:15pm appointment. You need to have appointment with drain clinic prior to this appointment. Contact information: [address] [place] [address] [facility] Follow up.   Why: rolling walker Contact information: [place] [address] [phone]        [facility], [facility] Follow up.   Specialty: Home Health Services Why: [facility]/physical therapy-SOC Friday.       Christain Sacramento, MD. Schedule an appointment as soon as possible for a visit in 5 day(s).   Specialty: Family Medicine Why: To be seen with repeat labs (CBC & BMP).  Interventional radiology will arrange follow-up for the patient in their drain clinic for management of percutaneous abdominal drain. Contact information: [address] [place] [address] [phone]                Home Health: PT and RN Equipment/Devices: Rolling walker with 5 inch wheels.  Patient declined 3 and 1.  Discharge Condition: Improved and stable CODE STATUS: Full Diet recommendation: Heart healthy & diabetic diet.  Discharge Diagnoses:  Principal Problem:   Acute appendicitis with perforation and peritoneal abscess Active Problems:   Degeneration of lumbar intervertebral disc   Type 2 diabetes mellitus (HCC)   Primary hypertension   Hyperglycemia   Severe obesity (BMI 35.0-35.9 with comorbidity) (HCC)   Hypokalemia   Sepsis (HCC)   Severe sepsis with acute organ dysfunction [facility])   Brief Summary: 64 year old male with  PMH of type II DM, HTN, HLD, chronic back pain, obesity, presented with 2-week history of worsening abdominal pain, fever, chills, fatigue, nausea and vomiting.  He was diagnosed with perforated appendicitis with associated abscess.    General surgery consulted and indicated that since he had symptoms for more than 72 hours with obvious abscess, surgery first would have increased risk of bowel injury and ostomy.  Therefore they recommended percutaneous drainage of the abscess cavity to calm down and control infection.  They indicated that most likely he would benefit from interval appendectomy in 6 weeks.  As per general surgery recommendations, IR placed percutaneous drain on 8/21.    Gradually improved.  Assessment & Plan:   Severe sepsis due to acute appendicitis with perforation and abscess, POA: Met sepsis criteria on admission (tachycardia, fever, leukocytosis) with evidence of endorgan damage by elevated lactate 5.1, transaminitis and elevated procalcitonin levels. General surgery consulted and indicated that since he had symptoms for more than 72 hours with obvious abscess, surgery first would have increased risk of bowel injury and ostomy. Therefore they recommended percutaneous drainage of the abscess cavity to calm down and control infection. They indicated that most likely he would benefit from interval appendectomy in 6 weeks.  Also given history of polyps in 2017 and upcoming 5-year screening colonoscopy follow-up, they recommended to do endoscopy preoperatively before seeing if patient needs something more aggressively on an interval appendectomy such as colectomy for possible polyp or tumor.  As per general surgery recommendations, IR placed percutaneous drain in right lower  quadrant abscess on 8/21.  IV Zosyn 8/19 >.  Urine culture <10K colonies/insignificant growth.  Blood culture x1: Gram-positive rods in anaerobic bottle only-culture reintubated for better growth.  BCID however negative.   Abscess culture shows Streptococcus gordonii, resistant to erythromycin but sensitive to others including penicillin.    Diet has been gradually advanced.  He has tolerated dinner last night and breakfast this morning without nausea or significant pain.  He has not used antiemetics since yesterday afternoon.  He has not used but a single dose of oral opioid over the last week.  Pain is mostly controlled with Tylenol.  He has been advised to eat frequent small meals and he verbalizes understanding.  He has had BMs.  As per surgery recommendations, will discharge on oral Augmentin to complete total 14 days course. Patient and significant other have been educated regarding drain management PTA.  At discharge outpatient follow-up with general surgery and drain clinic.  Added PPI, probiotics and bowel regimen for a short course.  Positive blood culture: BC ID showed gram-positive rods in 1/4 aerobic bottle.  As per pharmacy this speciated to [place] species  and the indicated that this is typically sensitive to Augmentin and hence plan to discharge on Augmentin to complete total 14 days treatment remains appropriate.  Type II DM with hyperglycemia: A1c 8.1 on 01/12/2020.  Holding home oral hypoglycemic medications and were resumed at discharge.    Hypokalemia: Replaced.  Magnesium normal.  Mild hyponatremia: Likely related to dehydration in the setting of poor oral intake and HCTZ use.  HCTZ held in the hospital.  HCTZ/ACEI resumed on discharge.  Close follow-up of BMP as outpatient.  Abnormal LFTs/mild transaminitis: Likely related to perforated acute appendicitis and sepsis.  Resolved.  Resume statin that was held in the hospital.  Leukocytosis:  Secondary to sepsis.  Antimicrobial management as above.  Resolved.  Essential hypertension: Continue prior home medications including amlodipine, HCTZ, ACEI.  Chronic back pain: No acute issues reported by patient.  Pulmonary  nodules: Incidental finding.  Outpatient follow-up with PCP and repeat CT chest in 3 to 6 months.  Body mass index is 36.34 kg/m./Morbid obesity.  Deconditioning: Per therapy recommendation, home with PT and rolling walker with 5 inch wheels, 3 and 1.  Nutritional Status Nutrition Problem: Increased nutrient needs Etiology: acute illness Signs/Symptoms: estimated needs Interventions: Prostat, MVI, Other (Comment) [employer] 1.4 po)    Consultations:  General surgery  Interventional radiology.  Procedures:  CT-guided placement of 12 French gauge drainage catheter into right lower quadrant abscess by IR on 01/13/2020.   Discharge Instructions  Discharge Instructions    Call MD for:  difficulty breathing, headache or visual disturbances   Complete by: As directed    Call MD for:  extreme fatigue   Complete by: As directed    Call MD for:  persistant dizziness or light-headedness   Complete by: As directed    Call MD for:  persistant nausea and vomiting   Complete by: As directed    Call MD for:  redness, tenderness, or signs of infection (pain, swelling, redness, odor or green/yellow discharge around incision site)   Complete by: As directed    Call MD for:  severe uncontrolled pain   Complete by: As directed    Call MD for:  temperature >100.4   Complete by: As directed    Diet - low sodium heart healthy   Complete by: As directed    Diet Carb Modified   Complete  by: As directed    Increase activity slowly   Complete by: As directed        Medication List    TAKE these medications   acetaminophen 500 MG tablet Commonly known as: TYLENOL Take 1-2 tablets (500-1,000 mg total) by mouth every 8 (eight) hours as needed for mild pain, moderate pain, fever or headache. What changed:   how much to take  when to take this  reasons to take this   amLODipine 5 MG tablet Commonly known as: NORVASC Take 5 mg by mouth daily.   amoxicillin-clavulanate  875-125 MG tablet Commonly known as: Augmentin Take 1 tablet by mouth 2 (two) times daily for 7 days.   aspirin EC 81 MG tablet Take 81 mg by mouth daily.   atorvastatin 40 MG tablet Commonly known as: LIPITOR Take 40 mg by mouth daily.   glipiZIDE 10 MG tablet Commonly known as: GLUCOTROL Take 10 mg by mouth daily before breakfast.   lisinopril-hydrochlorothiazide 20-12.5 MG tablet Commonly known as: ZESTORETIC Take 1 tablet by mouth daily.   LORazepam 1 MG tablet Commonly known as: ATIVAN Take 1 mg by mouth every 8 (eight) hours as needed for anxiety.   metFORMIN 500 MG 24 hr tablet Commonly known as: GLUCOPHAGE-XR Take 500 mg by mouth 2 (two) times daily.   methocarbamol 500 MG tablet Commonly known as: ROBAXIN Take 500 mg by mouth every 6 (six) hours as needed for muscle spasms.   multivitamin with minerals Tabs tablet Take 1 tablet by mouth daily. Start taking on: January 19, 2020   Normal Saline Flush 0.9 % Soln Once daily flushing of abdominal drain with 5 mls sterile NS, output recording and dressing changes every 1-2 days;   pantoprazole 40 MG tablet Commonly known as: Protonix Take 1 tablet (40 mg total) by mouth daily.   saccharomyces boulardii 250 MG capsule Commonly known as: FLORASTOR Take 1 capsule (250 mg total) by mouth 2 (two) times daily.   senna-docusate 8.6-50 MG tablet Commonly known as: Senokot-S Take 1 tablet by mouth 2 (two) times daily.      Allergies  Allergen Reactions  . Codeine Other (See Comments)    Felt jittery      Procedures/Studies: CT Abdomen Pelvis Wo Contrast  Result Date: 01/12/2020 CLINICAL DATA:  Severe abdominal pain and distention with nausea and vomiting EXAM: CT ABDOMEN AND PELVIS WITHOUT CONTRAST TECHNIQUE: Multidetector CT imaging of the abdomen and pelvis was performed following the standard protocol without IV contrast. COMPARISON:  None. FINDINGS: Technical note: Examination is degraded by patient motion  artifact. Lower chest: 6 mm triangular right lower lobe subpleural nodule (series 2, image 9). 3 mm right lower lobe nodule (series 2, image 41). Lung bases are otherwise clear. Heart size is normal. Coronary artery calcification is present. Hepatobiliary: Mildly decreased attenuation of the hepatic parenchyma suggesting hepatic steatosis. No focal liver lesion is identified on noncontrast study. Small layering hyperdensity within the gallbladder compatible with cholelithiasis. No pericholecystic inflammatory changes. No biliary dilatation. Pancreas: Unremarkable. No pancreatic ductal dilatation or surrounding inflammatory changes. Spleen: Normal in size without focal abnormality. Adrenals/Urinary Tract: Adrenal glands are unremarkable. Kidneys are normal, without renal calculi, focal lesion, or hydronephrosis. Bladder is unremarkable for the degree of distension. Stomach/Bowel: Thick walled fluid collection adjacent to the base of the cecum along the posterior margin measuring approximately 8.0 x 5.0 x 5.0 cm (series 3, image 67; series 6, image 70) with surrounding fat stranding. Small focus of air is present  within the collection. Appendix is not definitively visualized. Remaining colon is within normal limits. Stomach and small bowel are unremarkable. No dilated loops of bowel to suggest obstruction. Vascular/Lymphatic: Scattered aortoiliac atherosclerotic calcifications without aneurysm. No abdominopelvic lymphadenopathy. Reproductive: Prostate is unremarkable. Other: No pneumoperitoneum.  No abdominal wall hernia. Musculoskeletal: Multilevel thoracolumbar spondylosis. No acute osseous findings. IMPRESSION: 1. Thick-walled fluid collection adjacent to the base of the cecum measuring up to 8.0 cm with surrounding fat stranding compatible with abscess. Appendix is not definitively visualized. Findings may be secondary to a focal colitis or appendicitis. A perforated colonic malignancy is not excluded. 2.  Cholelithiasis without evidence of acute cholecystitis. 3. Hepatic steatosis. 4. Two small pulmonary nodules measuring up to 6 mm. Non-contrast chest CT at 3-6 months is recommended. If the nodules are stable at time of repeat CT, then future CT at 18-24 months (from today's scan) is considered optional for low-risk patients, but is recommended for high-risk patients. This recommendation follows the consensus statement: Guidelines for Management of Incidental Pulmonary Nodules Detected on CT Images: From the Fleischner Society 2017; Radiology 2017; 284:228-243. Aortic Atherosclerosis (ICD10-I70.0). These results were called by telephone at the time of interpretation on 01/12/2020 at 2:31 pm to provider Donney Rankins, [place], who verbally acknowledged these results. Electronically Signed   By: Davina Poke D.O.   On: 01/12/2020 14:31   CT IMAGE GUIDED DRAINAGE BY PERCUTANEOUS CATHETER  Result Date: 01/14/2020 INDICATION: 64 year old male with right lower quadrant intra-abdominal abscess favored to represent perforated appendicitis. He presents for percutaneous drain placement. EXAM: CT abscess drain placement MEDICATIONS: The patient is currently admitted to the hospital and receiving intravenous antibiotics. The antibiotics were administered within an appropriate time frame prior to the initiation of the procedure. ANESTHESIA/SEDATION: Fentanyl 25 mcg IV; Versed 0.5 mg IV Moderate Sedation Time:  12 minutes The patient was continuously monitored during the procedure by the interventional radiology nurse under my direct supervision. COMPLICATIONS: None immediate. PROCEDURE: Informed written consent was obtained from the patient after a thorough discussion of the procedural risks, benefits and alternatives. All questions were addressed. Maximal Sterile Barrier Technique was utilized including caps, mask, sterile gowns, sterile gloves, sterile drape, hand hygiene and skin antiseptic. A timeout was performed prior to the  initiation of the procedure. A planning axial CT scan was performed with the patient in the supine window. There is a suboptimal window into the abscess cavity. Therefore, the patient was manipulated into the left lateral decubitus position. Repeat CT imaging was performed identifying a safe access into the right lower quadrant abscess. The skin was sterilely prepped and draped in the standard fashion using chlorhexidine skin prep. Local anesthesia was attained by infiltration with 1% lidocaine. A small dermatotomy was made. Under intermittent CT guidance, an 18 gauge trocar needle was advanced into the fluid collection. A 0.035 wire was coiled in the fluid collection. The skin tract was then dilated to 12 [place]. A 12 French all-purpose drainage catheter was advanced over the wire and formed in the fluid collection. Aspiration yields approximately 60 mL foul-smelling purulent fluid. Samples were sent for Gram stain and culture. The drainage catheter was flushed and connected to JP bulb suction before being secured to the skin with 0 Prolene suture. Post placement CT imaging demonstrates a well-positioned drainage catheter and no evidence of immediate complication. IMPRESSION: Successful placement of a 12 French drainage catheter into the right lower quadrant abscess. Electronically Signed   By: Jacqulynn Cadet M.D.   On: 01/14/2020 08:47  Subjective: Patient reports that he feels much better today.  After the episode of nausea yesterday afternoon, he no further episodes.  Tolerated dinner last night and breakfast this morning without nausea or vomiting.  Has used minimal pain medications other than Tylenol.  Mild pain at drain site.  He is being careful in eating small amounts each time.  Discharge Exam:  Vitals:   01/18/20 0422 01/18/20 0427 01/18/20 0900 01/18/20 1151  BP:  (!) 148/67  (!) 158/65  Pulse:  (!) 58  65  Resp:  18 19 18   Temp:  97.8 F (36.6 C)  98.3 F (36.8 C)  TempSrc:   Oral  Oral  SpO2:  94%  94%  Weight: 127.6 kg     Height:        General exam: Pleasant middle-age male, moderately built and obese sitting up comfortably in reclining chair this morning. Respiratory system: Clear to auscultation. Respiratory effort normal. Cardiovascular system: S1 & S2 heard, RRR. No JVD, murmurs, rubs, gallops or clicks. No pedal edema.  Telemetry: SR-occasional sinus bradycardia occasionally in the high 40s (likely during sleep) with occasional PVCs. Gastrointestinal system: Abdomen is nondistended/protuberant, soft and nontender. No organomegaly or masses felt. Normal bowel sounds heard.  RLQ drain intact with minimal bloodstained fluid in bulb. Central nervous system: Alert and oriented. No focal neurological deficits. Extremities: Symmetric 5 x 5 power. Skin: No rashes, lesions or ulcers Psychiatry: Judgement and insight appear normal. Mood & affect appropriate.     The results of significant diagnostics from this hospitalization (including imaging, microbiology, ancillary and laboratory) are listed below for reference.     Microbiology: Recent Results (from the past 240 hour(s))  Urine culture     Status: Abnormal   Collection Time: 01/12/20  2:06 PM   Specimen: Urine, Random  Result Value Ref Range Status   Specimen Description   Final    URINE, RANDOM Performed at [facility] [address], [place], [place] [address]    Special Requests   Final    NONE Performed at [facility], [place] [address], [place], [place] [address]    Culture (A)  Final    <10,000 COLONIES/mL INSIGNIFICANT GROWTH Performed at [facility] [address] [place], [place] [address]    Report Status 01/14/2020 FINAL  Final  Blood culture (routine x 2)     Status: None   Collection Time: 01/12/20  2:33 PM   Specimen: BLOOD  Result Value Ref Range Status   Specimen Description   Final    BLOOD RIGHT ANTECUBITAL Performed at [facility], [place] [address] [place], [place] [address]    Special Requests   Final    BOTTLES DRAWN AEROBIC AND ANAEROBIC Blood Culture results may not be optimal due to an excessive volume of blood received in culture bottles Performed at [facility] [address], [place], [place] [address]    Culture  Setup Time   Final    GRAM POSITIVE RODS ANAEROBIC BOTTLE ONLY CRITICAL RESULT CALLED TO, READ BACK BY AND VERIFIED WITH: M.MICKEAL AT 3491 ON 01/18/20 BY T.SAAD    Culture   Final    EGGERTHELLA SPECIES Standardized susceptibility testing for this organism is not available. Performed at [facility], [place] [address], [place], [place] [address]    Report Status 01/18/2020 FINAL  Final  Blood culture (routine x 2)     Status: None (Preliminary result)  Collection Time: 01/12/20  2:38 PM   Specimen: BLOOD  Result Value Ref Range Status   Specimen Description   Final    BLOOD LEFT WRIST Performed at [facility] [address], [place], [place] [address]    Special Requests   Final    BOTTLES DRAWN AEROBIC AND ANAEROBIC Blood Culture adequate volume Performed at [facility] [address], [place], [place] [address]    Culture  Setup Time   Final    GRAM POSITIVE RODS ANAEROBIC BOTTLE ONLY CRITICAL RESULT CALLED TO, READ BACK BY AND VERIFIED WITH: N.GLOGOVAC @2340  01/15/20 EB    Culture   Final    CULTURE REINCUBATED FOR BETTER GROWTH Performed at [facility], [address], [place], [place] [address]    Report Status PENDING  Incomplete  Blood Culture ID Panel (Reflexed)     Status: None   Collection Time: 01/12/20  2:38 PM  Result Value Ref Range Status   Enterococcus faecalis NOT DETECTED NOT DETECTED Final   Enterococcus Faecium NOT DETECTED NOT DETECTED Final   Listeria monocytogenes NOT DETECTED NOT DETECTED Final   Staphylococcus species NOT DETECTED NOT DETECTED Final   Staphylococcus  aureus (BCID) NOT DETECTED NOT DETECTED Final   Staphylococcus epidermidis NOT DETECTED NOT DETECTED Final   Staphylococcus lugdunensis NOT DETECTED NOT DETECTED Final   Streptococcus species NOT DETECTED NOT DETECTED Final   Streptococcus agalactiae NOT DETECTED NOT DETECTED Final   Streptococcus pneumoniae NOT DETECTED NOT DETECTED Final   Streptococcus pyogenes NOT DETECTED NOT DETECTED Final   A.calcoaceticus-baumannii NOT DETECTED NOT DETECTED Final   Bacteroides fragilis NOT DETECTED NOT DETECTED Final   Enterobacterales NOT DETECTED NOT DETECTED Final   Enterobacter cloacae complex NOT DETECTED NOT DETECTED Final   Escherichia coli NOT DETECTED NOT DETECTED Final   Klebsiella aerogenes NOT DETECTED NOT DETECTED Final   Klebsiella oxytoca NOT DETECTED NOT DETECTED Final   Klebsiella pneumoniae NOT DETECTED NOT DETECTED Final   Proteus species NOT DETECTED NOT DETECTED Final   Salmonella species NOT DETECTED NOT DETECTED Final   Serratia marcescens NOT DETECTED NOT DETECTED Final   Haemophilus influenzae NOT DETECTED NOT DETECTED Final   Neisseria meningitidis NOT DETECTED NOT DETECTED Final   Pseudomonas aeruginosa NOT DETECTED NOT DETECTED Final   Stenotrophomonas maltophilia NOT DETECTED NOT DETECTED Final   Candida albicans NOT DETECTED NOT DETECTED Final   Candida auris NOT DETECTED NOT DETECTED Final   Candida glabrata NOT DETECTED NOT DETECTED Final   Candida krusei NOT DETECTED NOT DETECTED Final   Candida parapsilosis NOT DETECTED NOT DETECTED Final   Candida tropicalis NOT DETECTED NOT DETECTED Final   Cryptococcus neoformans/gattii NOT DETECTED NOT DETECTED Final    Comment: Performed at [facility], [place]. [address], [place],  [address]  SARS Coronavirus 2 by RT PCR (hospital order, performed in [facility] hospital lab) Nasopharyngeal Nasopharyngeal Swab     Status: None   Collection Time: 01/13/20  9:04 AM   Specimen: Nasopharyngeal Swab  Result Value  Ref Range Status   SARS Coronavirus 2 NEGATIVE NEGATIVE Final    Comment: (NOTE) SARS-CoV-2 target nucleic acids are NOT DETECTED.  The SARS-CoV-2 RNA is generally detectable in upper and lower respiratory specimens during the acute phase of infection. The lowest concentration of SARS-CoV-2 viral copies this assay can detect is 250 copies / mL. A negative result does not preclude SARS-CoV-2 infection and should not be used as the sole  basis for treatment or other patient management decisions.  A negative result may occur with improper specimen collection / handling, submission of specimen other than nasopharyngeal swab, presence of viral mutation(s) within the areas targeted by this assay, and inadequate number of viral copies (<250 copies / mL). A negative result must be combined with clinical observations, patient history, and epidemiological information.  Fact Sheet for Patients:   StrictlyIdeas.no  Fact Sheet for Healthcare Providers: BankingDealers.co.za  This test is not yet approved or  cleared by the [place] FDA and has been authorized for detection and/or diagnosis of SARS-CoV-2 by FDA under an Emergency Use Authorization (EUA).  This EUA will remain in effect (meaning this test can be used) for the duration of the COVID-19 declaration under Section 564(b)(1) of the Act, 21 U.S.C. section 360bbb-3(b)(1), unless the authorization is terminated or revoked sooner.  Performed at [facility], [place] [address], [place], [place] [address]   Aerobic/Anaerobic Culture (surgical/deep wound)     Status: None (Preliminary result)   Collection Time: 01/13/20  3:55 PM   Specimen: Abscess  Result Value Ref Range Status   Specimen Description ABSCESS  Final   Special Requests NONE  Final   Gram Stain NO WBC SEEN RARE GRAM NEGATIVE RODS   Final   Culture   Final    MODERATE STREPTOCOCCUS GORDONII NO ANAEROBES  ISOLATED; CULTURE IN PROGRESS FOR 5 DAYS    Report Status PENDING  Incomplete   Organism ID, Bacteria STREPTOCOCCUS GORDONII  Final      Susceptibility   Streptococcus gordonii - MIC*    PENICILLIN <=0.06 SENSITIVE Sensitive     CEFTRIAXONE <=0.12 SENSITIVE Sensitive     ERYTHROMYCIN 2 RESISTANT Resistant     LEVOFLOXACIN 1 SENSITIVE Sensitive     VANCOMYCIN Value in next row Sensitive      0.5 SENSITIVEPerformed at [facility] [address] [place], [place] [address]    * MODERATE STREPTOCOCCUS GORDONII     Labs: CBC: Recent Labs  Lab 01/13/20 0300 01/14/20 0500 01/15/20 0500 01/16/20 0547 01/17/20 0512  WBC 15.2* 11.7* 11.1* 8.5 10.8*  NEUTROABS  --  8.9* 8.3*  --  7.1  HGB 12.3* 13.4 12.3* 12.9* 14.1  HCT 35.9* 39.2 35.7* 38.0* 42.0  MCV 89.1 89.3 90.2 90.3 89.7  PLT 254 228 237 257 098    Basic Metabolic Panel: Recent Labs  Lab 01/13/20 0300 01/14/20 0500 01/15/20 0500 01/16/20 0547 01/17/20 0512  NA 137 137 140 140 140  K 3.5 4.2 3.9 4.0 3.8  CL 101 105 105 103 101  CO2 23 24 25 28 27   GLUCOSE 121* 207* 162* 141* 138*  BUN 36* 31* 23 15 11   CREATININE 1.16 0.85 0.80 0.78 0.80  CALCIUM 7.9* 7.7* 8.0* 8.4* 8.8*  MG  --  2.3 1.9 1.9 1.8  PHOS  --  2.2* 2.6  --   --     Liver Function Tests: Recent Labs  Lab 01/13/20 0300 01/14/20 0500 01/15/20 0500 01/16/20 0547 01/17/20 0512  AST 49* 57* 44* 37 30  ALT 46* 51* 49* 46* 44  ALKPHOS 79 111 104 102 106  BILITOT 2.4* 1.8* 1.6* 1.4* 1.1  PROT 5.8* 5.9* 5.7* 5.8* 6.8  ALBUMIN 2.6* 2.6* 2.4* 2.5* 3.0*    CBG: Recent Labs  Lab 01/17/20 1130 01/17/20 1731 01/17/20 2031 01/18/20 0731 01/18/20 1149  GLUCAP 201* 163* 202* 124* 152*     Urinalysis    Component  Value Date/Time   COLORURINE AMBER (A) 01/12/2020 1406   APPEARANCEUR CLEAR 01/12/2020 1406   LABSPEC 1.027 01/12/2020 1406   PHURINE 5.0 01/12/2020 1406   GLUCOSEU NEGATIVE 01/12/2020 1406   HGBUR NEGATIVE 01/12/2020 1406    BILIRUBINUR SMALL (A) 01/12/2020 1406   KETONESUR NEGATIVE 01/12/2020 1406   PROTEINUR 30 (A) 01/12/2020 1406   NITRITE NEGATIVE 01/12/2020 1406   LEUKOCYTESUR NEGATIVE 01/12/2020 1406      Time coordinating discharge: 45 minutes  SIGNED:  Vernell Leep, MD, FACP, [facility]. [facility]  To contact the attending provider between 7A-7P or the covering provider during after hours 7P-7A, please log into the web site [email or web address] and access using universal [place] password for that web site. If you do not have the password, please call the hospital operator.

## 2020-01-18 NOTE — Progress Notes (Signed)
PHARMACY - PHYSICIAN COMMUNICATION CRITICAL VALUE ALERT - BLOOD CULTURE IDENTIFICATION (BCID)  Albert Mayer is an 64 y.o. male who presented to [facility] on 01/12/2020 with a chief complaint of abdominal pain.  Assessment: 1/4 anaerobic bottle - gram positive rods (eggerthella species)  Name of physician (or Provider) Contacted: [place] Also notified Nicoletta Dress, ID Pharmacist  Current antibiotics: Zosyn 3.375 g IV infused over 4 hours q8h  Changes to prescribed antibiotics recommended:  No changes recommended.  Continuing with original plan of Zosyn and Augmentin on discharge to complete 14 days total duration appropriate.   Results for orders placed or performed during the hospital encounter of 01/12/20  Blood Culture ID Panel (Reflexed) (Collected: 01/12/2020  2:38 PM)  Result Value Ref Range   Enterococcus faecalis NOT DETECTED NOT DETECTED   Enterococcus Faecium NOT DETECTED NOT DETECTED   Listeria monocytogenes NOT DETECTED NOT DETECTED   Staphylococcus species NOT DETECTED NOT DETECTED   Staphylococcus aureus (BCID) NOT DETECTED NOT DETECTED   Staphylococcus epidermidis NOT DETECTED NOT DETECTED   Staphylococcus lugdunensis NOT DETECTED NOT DETECTED   Streptococcus species NOT DETECTED NOT DETECTED   Streptococcus agalactiae NOT DETECTED NOT DETECTED   Streptococcus pneumoniae NOT DETECTED NOT DETECTED   Streptococcus pyogenes NOT DETECTED NOT DETECTED   A.calcoaceticus-baumannii NOT DETECTED NOT DETECTED   Bacteroides fragilis NOT DETECTED NOT DETECTED   Enterobacterales NOT DETECTED NOT DETECTED   Enterobacter cloacae complex NOT DETECTED NOT DETECTED   Escherichia coli NOT DETECTED NOT DETECTED   Klebsiella aerogenes NOT DETECTED NOT DETECTED   Klebsiella oxytoca NOT DETECTED NOT DETECTED   Klebsiella pneumoniae NOT DETECTED NOT DETECTED   Proteus species NOT DETECTED NOT DETECTED   Salmonella species NOT DETECTED NOT DETECTED   Serratia marcescens NOT DETECTED NOT  DETECTED   Haemophilus influenzae NOT DETECTED NOT DETECTED   Neisseria meningitidis NOT DETECTED NOT DETECTED   Pseudomonas aeruginosa NOT DETECTED NOT DETECTED   Stenotrophomonas maltophilia NOT DETECTED NOT DETECTED   Candida albicans NOT DETECTED NOT DETECTED   Candida auris NOT DETECTED NOT DETECTED   Candida glabrata NOT DETECTED NOT DETECTED   Candida krusei NOT DETECTED NOT DETECTED   Candida parapsilosis NOT DETECTED NOT DETECTED   Candida tropicalis NOT DETECTED NOT DETECTED   Cryptococcus neoformans/gattii NOT DETECTED NOT DETECTED    Efraim Kaufmann, PharmD, BCPS 01/18/2020  11:41 AM

## 2020-01-18 NOTE — Progress Notes (Signed)
Pt and significant other given and explained discharge instructions. Pt and wife also educated on how to irrigate, re-charge, and how to apply the dressing to the JP drain. Pt given all supplies needed for home to properly change the dressing. Pt educated on importance of monitoring the output and surrounding skin of the drain. Pt was dressed in personal clothing and taken to the main entrance via wheelchair.

## 2020-01-18 NOTE — Discharge Instructions (Signed)
Appendicitis, Adult  The appendix is a tube in the body that is shaped like a finger. It is attached to the large intestine. Appendicitis means that this tube is swollen (inflamed). If this is not treated, the tube can tear (rupture). This can lead to a life-threatening infection. This condition can also cause pus to build up in the appendix (abscess). What are the causes? This condition may be caused by something that blocks the appendix. These include:  A ball of poop (stool).  Lymph glands that are bigger than normal. Sometimes the cause is not known. What increases the risk? You are more likely to develop this condition if you are between 10 and 30 years of age. What are the signs or symptoms? Symptoms of this condition include:  Pain around the belly button (navel). ? The pain moves toward the lower right belly (abdomen). ? The pain can get worse with time. ? The pain can get worse if you cough. ? The pain can get worse if you move suddenly.  Tenderness in the lower right belly.  Feeling sick to your stomach (nauseous).  Throwing up (vomiting).  Not feeling hungry (loss of appetite).  A fever.  Having trouble pooping (constipation).  Watery poop (diarrhea).  Not feeling well. How is this treated? Most often, this condition is treated by taking out the appendix (appendectomy). There are two ways to do this:  Open surgery. For this method, the appendix is taken out through a large cut (incision). The cut is made in the lower right belly. This surgery may be used if: ? You have scars from another surgery. ? You have a bleeding condition. ? You are pregnant and will be having your baby soon. ? You have a condition that makes it hard to do the other type of surgery.  Laparoscopic surgery. For this method, the appendix is taken out through small cuts. Often, this surgery: ? Causes less pain. ? Causes fewer problems. ? Is easier to heal from. If your appendix tears and  pus forms:  A drain may be put into the sore. The drain will be used to get rid of the pus.  You may get an antibiotic medicine through an IV line.  Your appendix may or may not need to be taken out. Follow these instructions at home: If you had surgery, follow instructions from your doctor on how to care for yourself at home and how to take care of your cut from surgery. Medicines  Take over-the-counter and prescription medicines only as told by your doctor.  If you were prescribed an antibiotic medicine, take it as told by your doctor. Do not stop taking the antibiotic even if you start to feel better. Eating and drinking Follow instructions from your doctor about what you cannot eat or drink. You may go back to your diet slowly if:  You no longer feel sick to your stomach.  You have stopped throwing up. General instructions  Do not use any products that contain nicotine or tobacco, such as cigarettes, e-cigarettes, and chewing tobacco. If you need help quitting, ask your doctor.  Do not drive or use heavy machinery while taking prescription pain medicine.  Ask your doctor if the medicine you are taking can cause trouble pooping. You may need to take steps to prevent or treat trouble pooping: ? Drink enough fluid to keep your pee (urine) pale yellow. ? Take over-the-counter or prescription medicines. ? Eat foods that are high in fiber. These include beans,   whole grains, and fresh fruits and vegetables. ? Limit foods that are high in fat and sugar. These include fried or sweet foods.  Keep all follow-up visits as told by your doctor. This is important. Contact a doctor if:  There is pus, blood, or a lot of fluid coming from your cut or cuts from surgery.  You are sick to your stomach or you throw up. Get help right away if:  You have pain in your belly, and the pain is getting worse.  You have a fever.  You have chills.  You are very tired.  You have muscle  pain.  You are short of breath. Summary  Appendicitis is swelling of the appendix. The appendix is a tube that is shaped like a finger. It is joined to the large intestine.  This condition may be caused by something that blocks the appendix. This can lead to an infection.  This condition is usually treated by taking out the appendix. This information is not intended to replace advice given to you by your health care provider. Make sure you discuss any questions you have with your health care provider. Document Revised: 10/27/2017 Document Reviewed: 10/27/2017 Elsevier Patient Education  [place].   Percutaneous Abscess Drain, Care After This sheet gives you information about how to care for yourself after your procedure. Your health care provider may also give you more specific instructions. If you have problems or questions, contact your health care provider. What can I expect after the procedure? After your procedure, it is common to have:  A small amount of bruising and discomfort in the area where the drainage tube (catheter) was placed.  Sleepiness and fatigue. This should go away after the medicines you were given have worn off. Follow these instructions at home: Incision care  Follow instructions from your health care provider about how to take care of your incision. Make sure you: ? Wash your hands with soap and water before you change your bandage (dressing). If soap and water are not available, use hand sanitizer. ? Change your dressing as told by your health care provider. ? Leave stitches (sutures), skin glue, or adhesive strips in place. These skin closures may need to stay in place for 2 weeks or longer. If adhesive strip edges start to loosen and curl up, you may trim the loose edges. Do not remove adhesive strips completely unless your health care provider tells you to do that.  Check your incision area every day for signs of infection. Check for: ? More  redness, swelling, or pain. ? More fluid or blood. ? Warmth. ? Pus or a bad smell. ? Fluid leaking from around your catheter (instead of fluid draining through your catheter). Catheter care   Follow instructions from your health care provider about emptying and cleaning your catheter and collection bag. You may need to clean the catheter every day so it does not clog.  If directed, write down the following information every time you empty your bag: ? The date and time. ? The amount of drainage. General instructions  Rest at home for 1-2 days after your procedure. Return to your normal activities as told by your health care provider.  Do not take baths, swim, or use a hot tub for 24 hours after your procedure, or until your health care provider says that this is okay.  Take over-the-counter and prescription medicines only as told by your health care provider.  Keep all follow-up visits as told by  your health care provider. This is important. Contact a health care provider if:  You have less than 10 mL of drainage a day for 2-3 days in a row, or as directed by your health care provider.  You have more redness, swelling, or pain around your incision area.  You have more fluid or blood coming from your incision area.  Your incision area feels warm to the touch.  You have pus or a bad smell coming from your incision area.  You have fluid leaking from around your catheter (instead of through your catheter).  You have a fever or chills.  You have pain that does not get better with medicine. Get help right away if:  Your catheter comes out.  You suddenly stop having drainage from your catheter.  You suddenly have blood in the fluid that is draining from your catheter.  You become dizzy or you faint.  You develop a rash.  You have nausea or vomiting.  You have difficulty breathing or you feel short of breath.  You develop chest pain.  You have problems with your speech  or vision.  You have trouble balancing or moving your arms or legs. Summary  It is common to have a small amount of bruising and discomfort in the area where the drainage tube (catheter) was placed.  You may be directed to record the amount of drainage from the bag every time you empty it.  Follow instructions from your health care provider about emptying and cleaning your catheter and collection bag. This information is not intended to replace advice given to you by your health care provider. Make sure you discuss any questions you have with your health care provider. Document Revised: 04/23/2017 Document Reviewed: 04/02/2016 Elsevier Patient Education  [place].   Additional discharge instructions  Please get your medications reviewed and adjusted by your Primary MD.  Please request your Primary MD to go over all Hospital Tests and Procedure/Radiological results at the follow up, please get all Hospital records sent to your Prim MD by signing hospital release before you go home.  If you had Pneumonia of Lung problems at the Hospital: Please get a 2 view Chest X ray done in 6-8 weeks after hospital discharge or sooner if instructed by your Primary MD.  If you have Congestive Heart Failure: Please call your Cardiologist or Primary MD anytime you have any of the following symptoms:  1) 3 pound weight gain in 24 hours or 5 pounds in 1 week  2) shortness of breath, with or without a dry hacking cough  3) swelling in the hands, feet or stomach  4) if you have to sleep on extra pillows at night in order to breathe  Follow cardiac low salt diet and 1.5 lit/day fluid restriction.  If you have diabetes Accuchecks 4 times/day, Once in AM empty stomach and then before each meal. Log in all results and show them to your primary doctor at your next visit. If any glucose reading is under 80 or above 300 call your primary MD immediately.  If you have  Seizure/Convulsions/Epilepsy: Please do not drive, operate heavy machinery, participate in activities at heights or participate in high speed sports until you have seen by Primary MD or a Neurologist and advised to do so again.  If you had Gastrointestinal Bleeding: Please ask your Primary MD to check a complete blood count within one week of discharge or at your next visit. Your endoscopic/colonoscopic biopsies that are pending  at the time of discharge, will also need to followed by your Primary MD.  Get Medicines reviewed and adjusted. Please take all your medications with you for your next visit with your Primary MD  Please request your Primary MD to go over all hospital tests and procedure/radiological results at the follow up, please ask your Primary MD to get all Hospital records sent to his/her office.  If you experience worsening of your admission symptoms, develop shortness of breath, life threatening emergency, suicidal or homicidal thoughts you must seek medical attention immediately by calling 911 or calling your MD immediately  if symptoms less severe.  You must read complete instructions/literature along with all the possible adverse reactions/side effects for all the Medicines you take and that have been prescribed to you. Take any new Medicines after you have completely understood and accpet all the possible adverse reactions/side effects.   Do not drive or operate heavy machinery when taking Pain medications.   Do not take more than prescribed Pain, Sleep and Anxiety Medications  Special Instructions: If you have smoked or chewed Tobacco  in the last 2 yrs please stop smoking, stop any regular Alcohol  and or any Recreational drug use.  Wear Seat belts while driving.  Please note You were cared for by a hospitalist during your hospital stay. If you have any questions about your discharge medications or the care you received while you were in the hospital after you are  discharged, you can call the unit and asked to speak with the hospitalist on call if the hospitalist that took care of you is not available. Once you are discharged, your primary care physician will handle any further medical issues. Please note that NO REFILLS for any discharge medications will be authorized once you are discharged, as it is imperative that you return to your primary care physician (or establish a relationship with a primary care physician if you do not have one) for your aftercare needs so that they can reassess your need for medications and monitor your lab values.  You can reach the hospitalist office at phone [phone] or fax [phone]   If you do not have a primary care physician, you can call [phone] for a physician referral.

## 2020-01-18 NOTE — Progress Notes (Signed)
Physical Therapy Treatment Patient Details Name: Albert Mayer MRN: [id number] DOB: August 03, 1955 Today's Date: 01/18/2020    History of Present Illness 64 y.o. male with history of NIDDM-2, HTN, HLD, chronic back pain and obesity presenting with abdominal pain. Dx of appendicitis with perforation and peritoneal abscess. s/p IR drain 01/13/20.    PT Comments    Pt has progressed well with mobility, he ambulated 300' with RW, no loss of balance. From PT standpoint, he is ready to DC home. PT goals met, will sign off. Encouraged pt to gradually progress activity as tolerated to recover from deconditioning.    Follow Up Recommendations  Home health PT     Equipment Recommendations  Rolling walker with 5" wheels;3in1 (PT)    Recommendations for Other Services       Precautions / Restrictions Precautions Precautions: Fall Precaution Comments: abdominal drain Restrictions Weight Bearing Restrictions: No    Mobility  Bed Mobility               General bed mobility comments: up on edge of bed  Transfers Overall transfer level: Independent   Transfers: Sit to/from Stand Sit to Stand: Independent            Ambulation/Gait Ambulation/Gait assistance: Modified independent (Device/Increase time) Gait Distance (Feet): 300 Feet Assistive device: Rolling walker (2 wheeled) Gait Pattern/deviations: WFL(Within Functional Limits) Gait velocity: WNL   General Gait Details: steady, no loss of balance   Stairs             Wheelchair Mobility    Modified Rankin (Stroke Patients Only)       Balance Overall balance assessment: Modified Independent                                          Cognition Arousal/Alertness: Awake/alert Behavior During Therapy: WFL for tasks assessed/performed Overall Cognitive Status: Within Functional Limits for tasks assessed                                        Exercises      General Comments         Pertinent Vitals/Pain Pain Assessment: No/denies pain    Home Living                      Prior Function            PT Goals (current goals can now be found in the care plan section) Acute Rehab PT Goals Patient Stated Goal: yardwork, fishing, get strength back PT Goal Formulation: With patient Time For Goal Achievement: 01/30/20 Potential to Achieve Goals: Good Progress towards PT goals: Goals met/education completed, patient discharged from PT    Frequency    Min 3X/week      PT Plan Current plan remains appropriate    Co-evaluation              AM-PAC PT "6 Clicks" Mobility   Outcome Measure  Help needed turning from your back to your side while in a flat bed without using bedrails?: A Little Help needed moving from lying on your back to sitting on the side of a flat bed without using bedrails?: A Little Help needed moving to and from a bed to a chair (including a wheelchair)?: None Help needed standing  up from a chair using your arms (e.g., wheelchair or bedside chair)?: None Help needed to walk in hospital room?: None Help needed climbing 3-5 steps with a railing? : A Little 6 Click Score: 21    End of Session Equipment Utilized During Treatment: Gait belt Activity Tolerance: Patient tolerated treatment well Patient left: with call bell/phone within reach;in chair Nurse Communication: Mobility status PT Visit Diagnosis: Difficulty in walking, not elsewhere classified (R26.2);Pain     Time: 1368-5992 PT Time Calculation (min) (ACUTE ONLY): 10 min  Charges:  $Gait Training: 8-22 mins           Blondell Reveal Kistler PT 01/18/2020  Acute Rehabilitation Services Pager [phone] Office [phone]

## 2020-01-18 NOTE — Progress Notes (Signed)
    CC: Abdominal pain  Subjective: He had some nausea after lunch yesterday he said Dr. Algis Liming kept him another day.  He seems to be doing better this a.m.  He still complains of some tenderness but on exam his pain is markedly improved.  Minimal output through the drain.  Objective: Vital signs in last 24 hours: Temp:  [97.8 F (36.6 C)-98.7 F (37.1 C)] 97.8 F (36.6 C) (08/26 0427) Pulse Rate:  [57-62] 58 (08/26 0427) Resp:  [18] 18 (08/26 0427) BP: (148-157)/(67-70) 148/67 (08/26 0427) SpO2:  [93 %-96 %] 94 % (08/26 0427) Weight:  [127.6 kg] 127.6 kg (08/26 0422) Last BM Date: 01/16/20 720 p.o. 273 IV Urine x4 Drain 10 cc No BM recorded Afebrile vital signs are stable Procalcitonin 3.31 today  Intake/Output from previous day: 08/25 0701 - 08/26 0700 In: 1013.5 [P.O.:720; IV Piggyback:273.5] Out: 10 [Drains:10] Intake/Output this shift: No intake/output data recorded.  General appearance: alert, cooperative and no distress Resp: clear to auscultation bilaterally GI: Soft, tenderness is markedly improved.  He is tolerating a diet this a.m. so far.  IR drain shows minimal output.  Lab Results:  Recent Labs    01/16/20 0547 01/17/20 0512  WBC 8.5 10.8*  HGB 12.9* 14.1  HCT 38.0* 42.0  PLT 257 355    BMET Recent Labs    01/16/20 0547 01/17/20 0512  NA 140 140  K 4.0 3.8  CL 103 101  CO2 28 27  GLUCOSE 141* 138*  BUN 15 11  CREATININE 0.78 0.80  CALCIUM 8.4* 8.8*   PT/INR No results for input(s): LABPROT, INR in the last 72 hours.  Recent Labs  Lab 01/13/20 0300 01/14/20 0500 01/15/20 0500 01/16/20 0547 01/17/20 0512  AST 49* 57* 44* 37 30  ALT 46* 51* 49* 46* 44  ALKPHOS 79 111 104 102 106  BILITOT 2.4* 1.8* 1.6* 1.4* 1.1  PROT 5.8* 5.9* 5.7* 5.8* 6.8  ALBUMIN 2.6* 2.6* 2.4* 2.5* 3.0*     Lipase  No results found for: LIPASE   Medications: . (feeding supplement) PROSource Plus  30 mL Oral BID BM  . acetaminophen  1,000 mg Oral  Q8H  . amLODipine  10 mg Oral Daily  . feeding supplement (KATE FARMS STANDARD 1.4)  325 mL Oral Daily  . insulin aspart  0-15 Units Subcutaneous TID WC  . lip balm  1 application Topical BID  . multivitamin with minerals  1 tablet Oral Daily  . pantoprazole (PROTONIX) IV  40 mg Intravenous Q24H  . saccharomyces boulardii  250 mg Oral BID  . senna-docusate  1 tablet Oral BID   . ondansetron (ZOFRAN) IV    . piperacillin-tazobactam (ZOSYN)  IV 3.375 g (01/18/20 0501)    Assessment/Plan Sepsis - WBC 15.2>>11.1>>11.7(8/23)>>8.5>> 10.8 -Procalcitonin 75.3>>35.39 >>19.99>>11.20>> 5.89 >>3.31 Degenerative disc disease Type II diabetes Hypertension BMI 35.95  Acute appendicitis with perforation IR drain 8/21 >> culture pending  FEN: IV fluids, HH/CM diet ID: Zosyn 8/20 >>day#7/  Recommending a total of 14 days antibiotic course DVT: SCD/may have chemical prophylaxis from our standpoint Follow up: Dr.Gross  Plan:  Will need follow up with IR, which they will schedule that for him.  He has a follow-up with Dr. Johney Maine on 02/05/2020 at 2:45 PM Dr. Rosendo Gros recommends a 14-day total course of antibiotics.  Please call if we can be of further assistance.      LOS: 6 days    Brylee Berk 01/18/2020 Please see Amion

## 2020-01-19 ENCOUNTER — Other Ambulatory Visit: Payer: Self-pay | Admitting: Surgery

## 2020-01-19 DIAGNOSIS — K651 Peritoneal abscess: Secondary | ICD-10-CM

## 2020-01-21 LAB — AEROBIC/ANAEROBIC CULTURE W GRAM STAIN (SURGICAL/DEEP WOUND): Gram Stain: NONE SEEN

## 2020-01-22 LAB — CULTURE, BLOOD (ROUTINE X 2): Special Requests: ADEQUATE

## 2020-01-23 ENCOUNTER — Other Ambulatory Visit: Payer: Self-pay

## 2020-01-23 ENCOUNTER — Ambulatory Visit
Admission: RE | Admit: 2020-01-23 | Discharge: 2020-01-23 | Disposition: A | Discharge status: Home / Self Care | Payer: Medicare Other | Source: Ambulatory Visit | Attending: Radiology | Admitting: Radiology

## 2020-01-23 ENCOUNTER — Ambulatory Visit
Admission: RE | Admit: 2020-01-23 | Discharge: 2020-01-23 | Disposition: A | Discharge status: Home / Self Care | Payer: Medicare Other | Source: Ambulatory Visit | Attending: Surgery | Admitting: Surgery

## 2020-01-23 DIAGNOSIS — K651 Peritoneal abscess: Secondary | ICD-10-CM

## 2020-01-23 HISTORY — PX: IR RADIOLOGIST EVAL & MGMT: IMG5224

## 2020-01-23 MED ORDER — IOPAMIDOL (ISOVUE-300) INJECTION 61%
100.0000 mL | Freq: Once | INTRAVENOUS | Status: AC | PRN
  Administered 2020-01-23: 100 mL via INTRAVENOUS

## 2020-01-23 NOTE — Progress Notes (Signed)
Referring Physician(s): Dr. Clyda Greener  Chief Complaint: The patient is seen in follow up today s/p image guided abscess drain placement to perforated appendix with a subsequent fluid collection on 8.21.21  History of present illness: 64 y.o, male outpatient. History of DM, HTN, HLD, Chronic back pain. Presented to the ED with abdominal pain X 2 weeks found to have a perforated appendix with abscess. IR placed an abscess drain on 8.21.21. Cultures taken at time of placement shows moderate streptococcus gordonii and bacteroides fragilis. No additional images taken prior to discharge on  8.22.21.  He is currently afebrile, reports no increasing abdominal pain, nausea, vomiting. He is not flushing the drain. He reports minimal output of light yellow/brown fluid from the drain.    Past Medical History:  Diagnosis Date   Allergy    seasonal   Arthritis    Diabetes mellitus without complication ([place])    Hyperlipidemia    Hypertension     Past Surgical History:  Procedure Laterality Date   WISDOM TOOTH EXTRACTION      Allergies: Codeine  Medications: Prior to Admission medications   Medication Sig Start Date End Date Taking? Authorizing Provider  acetaminophen (TYLENOL) 500 MG tablet Take 1-2 tablets (500-1,000 mg total) by mouth every 8 (eight) hours as needed for mild pain, moderate pain, fever or headache. 01/18/20   Hongalgi, Lenis Dickinson, MD  amLODipine (NORVASC) 5 MG tablet Take 5 mg by mouth daily.    [provider]  amoxicillin-clavulanate (AUGMENTIN) 875-125 MG tablet Take 1 tablet by mouth 2 (two) times daily for 7 days. 01/18/20 01/25/20  Hongalgi, Lenis Dickinson, MD  aspirin EC 81 MG tablet Take 81 mg by mouth daily.    [provider]  atorvastatin (LIPITOR) 40 MG tablet Take 40 mg by mouth daily. 11/24/19   [provider]  glipiZIDE (GLUCOTROL) 10 MG tablet Take 10 mg by mouth daily before breakfast.    [provider]    lisinopril-hydrochlorothiazide (PRINZIDE,ZESTORETIC) 20-12.5 MG tablet Take 1 tablet by mouth daily.    [provider]  LORazepam (ATIVAN) 1 MG tablet Take 1 mg by mouth every 8 (eight) hours as needed for anxiety.  11/29/19   [provider]  metFORMIN (GLUCOPHAGE-XR) 500 MG 24 hr tablet Take 500 mg by mouth 2 (two) times daily. 08/29/19   [provider]  methocarbamol (ROBAXIN) 500 MG tablet Take 500 mg by mouth every 6 (six) hours as needed for muscle spasms.  11/29/19   [provider]  Multiple Vitamin (MULTIVITAMIN WITH MINERALS) TABS tablet Take 1 tablet by mouth daily. 01/19/20   Hongalgi, Lenis Dickinson, MD  pantoprazole (PROTONIX) 40 MG tablet Take 1 tablet (40 mg total) by mouth daily. 01/18/20 02/17/20  Hongalgi, Lenis Dickinson, MD  saccharomyces boulardii (FLORASTOR) 250 MG capsule Take 1 capsule (250 mg total) by mouth 2 (two) times daily. 01/18/20   Hongalgi, Lenis Dickinson, MD  senna-docusate (SENOKOT-S) 8.6-50 MG tablet Take 1 tablet by mouth 2 (two) times daily. 01/18/20   Hongalgi, Lenis Dickinson, MD  Sodium Chloride Flush (NORMAL SALINE FLUSH) 0.9 % SOLN Once daily flushing of abdominal drain with 5 mls sterile NS, output recording and dressing changes every 1-2 days; 01/18/20   Modena Jansky, MD     Family History  Problem Relation Age of Onset   Breast cancer Mother    Liver cancer Mother    Melanoma Father    Leukemia Father    Colon cancer Brother  Social History   Socioeconomic History   Marital status: Widowed    Spouse name: Not on file   Number of children: Not on file   Years of education: Not on file   Highest education level: Not on file  Occupational History   Not on file  Tobacco Use   Smoking status: Never Smoker   Smokeless tobacco: Never Used  Substance and Sexual Activity   Alcohol use: No   Drug use: No   Sexual activity: Not on file  Other Topics Concern   Not on file  Social History Narrative   Not on file    Social Determinants of Health   Financial Resource Strain:    Difficulty of Paying Living Expenses: Not on file  Food Insecurity:    Worried About [place] in the Last Year: Not on file   Ran Out of Food in the Last Year: Not on file  Transportation Needs:    Lack of Transportation (Medical): Not on file   Lack of Transportation (Non-Medical): Not on file  Physical Activity:    Days of Exercise per Week: Not on file   Minutes of Exercise per Session: Not on file  Stress:    Feeling of Stress : Not on file  Social Connections:    Frequency of Communication with Friends and Family: Not on file   Frequency of Social Gatherings with Friends and Family: Not on file   Attends Religious Services: Not on file   Active Member of Clubs or Organizations: Not on file   Attends Archivist Meetings: Not on file   Marital Status: Not on file     Vital Signs: There were no vitals taken for this visit.  Physical Exam Constitutional:      Appearance: He is well-developed.  HENT:     Head: Normocephalic.  Pulmonary:     Effort: Pulmonary effort is normal.  Abdominal:     Comments: Positive RUQ drain  to suction..Site is unremarkable with no erythema, edema, tenderness, bleeding or drainage noted at exit site. Suture and stat lock in place. Dressing is clean dry and intact. 5 ml of  tan colored fluid noted in bulb suction device.    Musculoskeletal:        General: Normal range of motion.     Cervical back: Normal range of motion.  Skin:    General: Skin is dry.  Neurological:     Mental Status: He is alert and oriented to person, place, and time.     Imaging: No results found.  Labs:  CBC: Recent Labs    01/14/20 0500 01/15/20 0500 01/16/20 0547 01/17/20 0512  WBC 11.7* 11.1* 8.5 10.8*  HGB 13.4 12.3* 12.9* 14.1  HCT 39.2 35.7* 38.0* 42.0  PLT 228 237 257 355    COAGS: Recent Labs    01/12/20 1456 01/13/20 0300  INR 1.3* 1.3*     BMP: Recent Labs    01/14/20 0500 01/15/20 0500 01/16/20 0547 01/17/20 0512  NA 137 140 140 140  K 4.2 3.9 4.0 3.8  CL 105 105 103 101  CO2 24 25 28 27   GLUCOSE 207* 162* 141* 138*  BUN 31* 23 15 11   CALCIUM 7.7* 8.0* 8.4* 8.8*  CREATININE 0.85 0.80 0.78 0.80  GFRNONAA >60 >60 >60 >60  GFRAA >60 >60 >60 >60    LIVER FUNCTION TESTS: Recent Labs    01/14/20 0500 01/15/20 0500 01/16/20 0547 01/17/20 0512  BILITOT 1.8*  1.6* 1.4* 1.1  AST 57* 44* 37 30  ALT 51* 49* 46* 44  ALKPHOS 111 104 102 106  PROT 5.9* 5.7* 5.8* 6.8  ALBUMIN 2.6* 2.4* 2.5* 3.0*    Assessment:  64 y.o, male outpatient. History of DM, HTN, HLD, Chronic back pain. Presented to the ED with abdominal pain X 2 weeks found to have a perforated appendix with associated abscess. IR placed an abscess drain on 8.21.21. Cultures taken at time of placement shows moderate streptococcus gordonii and bacteroides fragilis. No additional images taken prior to discharge on  8.22.21.  He is currently afebrile, reports no increasing abdominal pain, nausea, vomiting. Patient does endorse "soreness" around the drain's exit site. Patient is flushing daily 5 ml and reports  35 ml, 25 ml and 20 ml of tan colored output from the drain. Patient is currently on Augmentin.  CT abdomen pelvis shows a persistent but decreasing abscess . Images were reviewed by Dr. Kathlene Cote.  Patient to continue current drain site care protocol with daily flushes. Patient is scheduled to follow up with Dr. Johney Maine on 9.13.21   Signed: Jacqualine Mau, NP 01/23/2020, 2:19 PM   Please refer to Dr. Kathlene Cote attestation of this note for management and plan.

## 2020-01-30 ENCOUNTER — Ambulatory Visit: Payer: 59 | Admitting: Neurology

## 2020-02-05 ENCOUNTER — Ambulatory Visit: Payer: Self-pay | Admitting: Surgery

## 2020-02-05 NOTE — H&P (Signed)
[facility] Appointment: 02/05/2020 3:15 PM Location: [facility] Patient #: [id number] DOB: [birth year] Single / Language: [facility] / Race: Refused to Report/Unreported Male  History of Present Illness Albert Hector MD; 02/05/2020 4:58 PM) The patient is a 64 year old male who presents with appendicitis. Note for "Appendicitis": ` ` ` Patient sent for surgical consultation at the request of Dr Albert Mayer  Chief Complaint: Appendicitis with abscess. ` ` The patient is a pleasant obese male with severe pain 10 days prior to admission with intra-abdominal abscess retrocecal I suspicious for perforated appendicitis. He underwent percutaneous drainage. He stabilized eventually recovered and went home a week later. His transition to oral antibiotics. He's had a follow-up drain study which showed no abscess cavity nor fistula. Drainage output was somewhat purulence of a held off and removal.  Patient comes any by himself. He notes the drainage output has tapered off in its become more clear. He finished his Augmentin antibiotics. He is moving his bowels every day. He is eating well. Appetite is good. He does have a history of colon polyps. He had numerous polyps removed in 2017 by Dr. Havery [facility] with [facility]. Numerous to is adenomatous. 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 I cannot determine what exactly the follow-up was but I suspected was 3 years, 5 at the latest. Patient recalls being told he is overdue.  (Review of systems as stated in this history (HPI) or in the review of systems. Otherwise all other 12 point ROS are negative) ` ` ###########################################`  This patient encounter took 35 minutes today to perform the following: obtain history, perform exam, review outside records, interpret tests & imaging, counsel the patient on their diagnosis; and, document this  encounter, including findings & plan in the electronic Mayer record (EHR).   Allergies [facility], [place]; 02/05/2020 3:01 PM) Codeine Phosphate *ANALGESICS - OPIOID* patient stated that codeine gives him the gitters Allergies Reconciled  Medication History [facility], RMA; 02/05/2020 3:03 PM) Pantoprazole Sodium (40MG  Tablet DR, Oral) Active. Methocarbamol (500MG  Tablet, Oral) Active. Atorvastatin Calcium (40MG  Tablet, Oral) Active. metFORMIN HCl ER (500MG  Tablet ER 24HR, Oral) Active. amLODIPine Besylate (5MG  Tablet, Oral) Active. glipiZIDE ER (10MG  Tablet ER 24HR, Oral) Active. Lisinopril-hydroCHLOROthiazide (20-12.5MG  Tablet, Oral) Active. Saccharomyces boulardii (250MG  Capsule, Oral) Active. Medications Reconciled    Vitals Lattie Haw Caldwell RMA; 02/05/2020 3:04 PM) 02/05/2020 3:04 PM Weight: 268.5 lb Height: 72in Body Surface Area: 2.41 m Body Mass Index: 36.41 kg/m  Temp.: 98.61F  Pulse: 83 (Regular)  P.OX: 95% (Room air) BP: 120/80(Sitting, Left Arm, Standard)        Physical Exam Albert Hector MD; 02/05/2020 3:19 PM)  General Mental Status-Alert. General Appearance-Not in acute distress, Not Sickly. Orientation-Oriented X3. Hydration-Well hydrated. Voice-Normal.  Integumentary Global Assessment Upon inspection and palpation of skin surfaces of the - Axillae: non-tender, no inflammation or ulceration, no drainage. and Distribution of scalp and body hair is normal. General Characteristics Temperature - normal warmth is noted.  Head and Neck Head-normocephalic, atraumatic with no lesions or palpable masses. Face Global Assessment - atraumatic, no absence of expression. Neck Global Assessment - no abnormal movements, no bruit auscultated on the right, no bruit auscultated on the left, no decreased range of motion, non-tender. Trachea-midline. Thyroid Gland Characteristics - non-tender.  Eye Eyeball -  Left-Extraocular movements intact, No Nystagmus - Left. Eyeball - Right-Extraocular movements intact, No Nystagmus - Right. Cornea - Left-No Hazy - Left. Cornea -  Right-No Hazy - Right. Sclera/Conjunctiva - Left-No scleral icterus, No Discharge - Left. Sclera/Conjunctiva - Right-No scleral icterus, No Discharge - Right. Pupil - Left-Direct reaction to light normal. Pupil - Right-Direct reaction to light normal.  ENMT Ears Pinna - Left - no drainage observed, no generalized tenderness observed. Pinna - Right - no drainage observed, no generalized tenderness observed. Nose and Sinuses External Inspection of the Nose - no destructive lesion observed. Inspection of the nares - Left - quiet respiration. Inspection of the nares - Right - quiet respiration. Mouth and Throat Lips - Upper Lip - no fissures observed, no pallor noted. Lower Lip - no fissures observed, no pallor noted. Nasopharynx - no discharge present. Oral Cavity/Oropharynx - Tongue - no dryness observed. Oral Mucosa - no cyanosis observed. Hypopharynx - no evidence of airway distress observed.  Chest and Lung Exam Inspection Movements - Normal and Symmetrical. Accessory muscles - No use of accessory muscles in breathing. Palpation Palpation of the chest reveals - Non-tender. Auscultation Breath sounds - Normal and Clear.  Cardiovascular Auscultation Rhythm - Regular. Murmurs & Other Heart Sounds - Auscultation of the heart reveals - No Murmurs and No Systolic Clicks.  Abdomen Inspection Inspection of the abdomen reveals - No Visible peristalsis and No Abnormal pulsations. Umbilicus - No Bleeding, No Urine drainage. Palpation/Percussion Palpation and Percussion of the abdomen reveal - Soft, Non Tender, No Rebound tenderness, No Rigidity (guarding) and No Cutaneous hyperesthesia. Note: Abdomen obese but soft. Moderate diastases recti. Nontender. Not distended. No umbilical or incisional hernias. No  guarding.  Drainage of right flank with serosanguineous output. Output less than 10 mL a day for the past week. I removed the drain without incident.  Male Genitourinary Sexual Maturity Tanner 5 - Adult hair pattern and Adult penile size and shape.  Peripheral Vascular Upper Extremity Inspection - Left - No Cyanotic nailbeds - Left, Not Ischemic. Inspection - Right - No Cyanotic nailbeds - Right, Not Ischemic.  Neurologic Neurologic evaluation reveals -normal attention span and ability to concentrate, able to name objects and repeat phrases. Appropriate fund of knowledge , normal sensation and normal coordination. Mental Status Affect - not angry, not paranoid. Cranial Nerves-Normal Bilaterally. Gait-Normal.  Neuropsychiatric Mental status exam performed with findings of-able to articulate well with normal speech/language, rate, volume and coherence, thought content normal with ability to perform basic computations and apply abstract reasoning and no evidence of hallucinations, delusions, obsessions or homicidal/suicidal ideation.  Musculoskeletal Global Assessment Spine, Ribs and Pelvis - no instability, subluxation or laxity. Right Upper Extremity - no instability, subluxation or laxity.  Lymphatic Head & Neck  General Head & Neck Lymphatics: Bilateral - Description - No Localized lymphadenopathy. Axillary  General Axillary Region: Bilateral - Description - No Localized lymphadenopathy. Femoral & Inguinal  Generalized Femoral & Inguinal Lymphatics: Left - Description - No Localized lymphadenopathy. Right - Description - No Localized lymphadenopathy.    Assessment & Plan Albert Hector MD; 02/05/2020 3:23 PM)  ACUTE APPENDICITIS WITH PERFORATION AND PERITONEAL ABSCESS (K35.33) Impression: Pleasant gentleman with periappendiceal abscess status post percutaneous drainage and antibiotics. Consistent with appendicitis.  Follow-up drain study shows no more abscess and  drainage since been removed.  I think he would benefit from interval appendectomy 6-8 weeks from diagnosis. I would place it around mid-October. This allows inflammation to be minimal to have better success of a laparoscopic appendectomy. Hopefully an outpatient setting. Minimize risk of recurrence which is not insignificant.  I think it would be wise for him to  get a colonoscopy done before surgery since he is overdue and his history numerous polyps. Make sure there are no other surprises. See if this can be coordinated have colonoscopy the day before surgery and an appendectomy, limiting the need for multiple bowel problems.  Current Plans The anatomy & physiology of the digestive tract was discussed. The pathophysiology of appendicitis and other appendiceal disorders were discussed. Natural history risks without surgery was discussed. I feel the risks of no intervention will lead to serious problems that outweigh the operative risks; therefore, I recommended diagnostic laparoscopy with removal of appendix to remove the pathology. Laparoscopic & open techniques were discussed. I noted a good likelihood this will help address the problem. Risks such as bleeding, infection, abscess, leak, reoperation, possible ostomy, hernia, heart attack, death, and other risks were discussed. Goals of post-operative recovery were discussed as well. We will work to minimize complications. Questions were answered. The patient expresses understanding & wishes to proceed with surgery.   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 Numerous and 1 with high-grade dysplasia in 2017. He is pretty certain he was overdue for follow-up.  It would be wise for him to have a colonoscopy done prior to surgery to make sure there are no surprises.  Current Plans Pt Education - Polyps in the Colon and Rectum (Colonic and Rectal Polyps): colonic polyps  PREOP COLON - ENCOUNTER  FOR PREOPERATIVE EXAMINATION FOR GENERAL SURGICAL PROCEDURE (Z01.818)  Current Plans You are being scheduled for surgery- Our schedulers will call you.  You should hear from our office's scheduling department within 5 working days about the location, date, and time of surgery. We try to make accommodations for patient's preferences in scheduling surgery, but sometimes the OR schedule or the surgeon's schedule prevents [place] from making those accommodations.  If you have not heard from our office [phone]) in 5 working days, call the office and ask for your surgeon's nurse.  If you have other questions about your diagnosis, plan, or surgery, call the office and ask for your surgeon's nurse.  Written instructions provided Pt Education - [facility] Colon Bowel Prep 2018 ERAS/Miralax/Antibiotics Started Neomycin Sulfate 500 MG Oral Tablet, 2 (two) Tablet SEE NOTE, #6, 02/05/2020, No Refill. Local Order: Pharmacist Notes: TAKE TWO TABLETS AT 2 PM, 3 PM, AND 10 PM THE DAY PRIOR TO SURGERY Started Flagyl 500 MG Oral Tablet, 2 (two) Tablet SEE NOTE, #6, 02/05/2020, No Refill. Local Order: Pharmacist Notes: Take at 2pm, 3pm, and 10pm the day prior to your colon operation Pt Education - Pamphlet Given - Laparoscopic Colorectal Surgery: discussed with patient and provided information. Pt Education - [facility] Colectomy post-op instructions: discussed with patient and provided information.  DIASTASIS RECTI (M62.08)  Current Plans Pt Education - [facility] Diastasis Recti: discussed with patient and provided information.  Albert Hector, MD, FACS, MASCRS Gastrointestinal and Minimally Invasive Surgery  [facility] [address] [place],  [id number] [phone] Fax [phone] Main/Paging  CONTACT INFORMATION: Weekday (9AM-5PM) concerns: Call [facility] main office at [phone] Weeknight (5PM-9AM) or Weekend/Holiday concerns: Check [email or web address] for General Surgery [facility]  coverage (Please, do not use SecureChat as it is not reliable communication to operating surgeons for immediate patient care)

## 2020-02-06 ENCOUNTER — Telehealth: Payer: Self-pay | Admitting: Gastroenterology

## 2020-02-06 NOTE — Telephone Encounter (Signed)
Pt is requesting a call back from a nurse, did not wish to disclose further info

## 2020-02-06 NOTE — Telephone Encounter (Signed)
Thanks [place], I just sent you another message about this case actually. Okay to book him for colonoscopy in mid October in [facility], and Dr. Johney Maine will follow with appendectomy shortly thereafter. Dr. Johney Maine thinks he is stable and okay to perform colonoscopy 6-8 weeks after his initial perforated appendicitis, he has healed with antibiotics and drainage, he is asking for colonoscopy pre-surgery to ensure his colon is cleared. Thanks

## 2020-02-06 NOTE — Progress Notes (Signed)
Spoke with patient to schedule propofol colonoscopy. He is scheduled for a pre visit on 03/05/20 at 10 am and colonoscopy is scheduled for 03/14/20 at 3 pm with Dr. Havery [name].   Thank You

## 2020-02-06 NOTE — Telephone Encounter (Signed)
Spoke with patient he states that it will be about 4 weeks since he was in the hospital for acute appendicitis - recommending colonoscopy before surgery Saw Dr. Johney Mayer yesterday - had multiple CT scans done. Pt states that he has not been scheduled for surgery yet. Please advise, thank you

## 2020-02-06 NOTE — Telephone Encounter (Signed)
Spoke with patient regarding scheduling colonoscopy. Pt is scheduled for a pre-visit on 03/05/20 at 10 am, he is scheduled for colonoscopy on 03/14/20 at 3 pm with Dr. Havery [name]

## 2020-02-07 ENCOUNTER — Telehealth: Payer: Self-pay

## 2020-02-07 NOTE — Telephone Encounter (Signed)
-----   Message from Michael Boston, MD sent at 02/07/2020  7:31 AM EDT ----- Regarding: Perforated appendicitis patient who needs screening colonoscopy preop and interval appendectomy in mid October Thanks for getting this done.  Hopefully we can do surgery the next day. ----- Message ----- From: Yevette Edwards, RN Sent: 02/06/2020   1:37 PM EDT To: Michael Boston, MD, Illene Regulus, #    ----- Message ----- From: Yetta Flock, MD Sent: 02/06/2020  12:13 PM EDT To: Michael Boston, MD, Illene Regulus, #  Got it. Okay, that sounds reasonable, thanks for clarifying. Is there a date that you have scheduled his surgery? We can plan on doing colonoscopy shortly before then in mid October.   Ahava Kissoon, can you please help coordinate colonoscopy for this patient in mid October, and let [facility] / Dr. Johney Maine know date or coordinate with them the date so they can plan his surgery? Thanks  Richardson Landry ----- Message ----- From: Michael Boston, MD Sent: 02/06/2020   8:35 AM EDT To: Illene Regulus, Yetta Flock, MD  Usually after 6-8 weeks, colonoscopy is safe = mid October for this patient who had the abscess in August.  This is the standard plan I do with these patients that come in with an abscess most likely related to appendicitis or diverticulitis.  Cool things down with the drain and antibiotics.  Wait 6 weeks.  Then we get him scoped the day before planned sigmoid colectomy.  Have not brought any problems with that aside from some GI docs being nervous about doing it.  But you can!  This patient turned around quickly.  His drain study showed no more abscess at 2 weeks and I pulled his drain out now at 3 weeks.  He has been off antibiotics without any recurrence.  He is probably safe now, but like to hedge and wait for information to come down around 6-8 weeks.  But I do not want to make much longer to risk recurrent appendicitis ----- Message ----- From: Yetta Flock, MD Sent:  02/06/2020   8:07 AM EDT To: Michael Boston, MD  Elnora Morrison, Yes no problem I can fit him in and get this done prior to his operation with you. When you do think he would be stable to have colonoscopy in regards to his perforation history? I can plan this to be done in October if that works for you.  Richardson Landry ----- Message ----- From: Michael Boston, MD Sent: 02/05/2020   5:01 PM EDT To: Yetta Flock, MD  Patient admitted for perforated appendicitis with abscess.  Percutaneous drainage improving.  Planning interval appendectomy.  He also had numerous polyps on colonoscopy by you in 2017.  1 with high-grade dysplasia.  He recalls a 3-year follow-up was recommended for which he is overdue.  I think it would be wise for him to get colonoscopy prior to considering appendectomy just to make sure he does not need any ileocecectomy or some other surgery for some other pathology being missed.  Ideally patient would get surgery and endoscopy in mid October.  I know that it isshort notice but wondering now if your group can fit it in

## 2020-03-05 ENCOUNTER — Other Ambulatory Visit: Payer: Self-pay

## 2020-03-05 ENCOUNTER — Ambulatory Visit (AMBULATORY_SURGERY_CENTER): Payer: Self-pay

## 2020-03-05 VITALS — Ht 74.0 in | Wt 273.4 lb

## 2020-03-05 DIAGNOSIS — Z86010 Personal history of colonic polyps: Secondary | ICD-10-CM

## 2020-03-05 DIAGNOSIS — Z800 Family history of malignant neoplasm of digestive organs: Secondary | ICD-10-CM

## 2020-03-05 MED ORDER — SUTAB 1479-225-188 MG PO TABS: 12.0000 | ORAL_TABLET | ORAL | 0 refills | Status: DC

## 2020-03-05 NOTE — Progress Notes (Signed)
No allergies to soy or egg Pt is not on blood thinners or diet pills Denies issues with sedation/intubation Denies atrial flutter/fib Denies constipation   Emmi instructions given to pt  Pt is aware of Covid safety and care partner requirements.    Pt states Dr. Johney Maine prescribed an RX to take the day before colonoscopy.  Pt does not know what it is but will call back to let [place] know.

## 2020-03-07 NOTE — Patient Instructions (Addendum)
DUE TO COVID-19 ONLY ONE VISITOR IS ALLOWED TO COME WITH YOU AND STAY IN THE WAITING ROOM ONLY DURING PRE OP AND PROCEDURE DAY OF SURGERY. THE 1 VISITOR  MAY VISIT WITH YOU AFTER SURGERY IN YOUR PRIVATE ROOM DURING VISITING HOURS ONLY!  YOU NEED TO HAVE A COVID 19 TEST ON: 03/12/20 @ 9:00 AM , THIS TEST MUST BE DONE BEFORE SURGERY,  COVID TESTING SITE [place] [address], IT IS ON THE RIGHT GOING OUT [address] APPROXIMATELY  2 MINUTES PAST [employer] ON THE RIGHT. ONCE YOUR COVID TEST IS COMPLETED,  PLEASE BEGIN THE QUARANTINE INSTRUCTIONS AS OUTLINED IN YOUR HANDOUT.                Albert Mayer    Your procedure is scheduled on: 03/15/20   Report to [facility] Main  Entrance   Report to admitting at: 1:00 PM     Call this number if you have problems the morning of surgery [phone]    Remember:   DRINK 2 [place] AT  1000 PM AND 1 PRESURGERY DRINK THE DAY OF THE PROCEDURE 3 HOURS PRIOR TO SCHEDULED SURGERY. NO SOLIDS AFTER MIDNIGHT THE DAY PRIOR TO THE SURGERY. NOTHING BY MOUTH EXCEPT CLEAR LIQUIDS UNTIL THREE HOURS PRIOR TO SCHEDULED SURGERY. PLEASE FINISH PRESURGERY GATORADE DRINK PER SURGEON ORDER 3 HOURS PRIOR TO SCHEDULED SURGERY TIME WHICH NEEDS TO BE COMPLETED AT: 12:00 PM.  CLEAR LIQUID DIET   Foods Allowed                                                                     Foods Excluded  Coffee and tea, regular and decaf                             liquids that you cannot  Plain Jell-O any favor except red or purple                                           see through such as: Fruit ices (not with fruit pulp)                                     milk, soups, orange juice  Iced Popsicles                                    All solid food Carbonated beverages, regular and diet                                    Cranberry, grape and apple juices Sports drinks like Gatorade Lightly  seasoned clear broth or consume(fat free) Sugar, honey syrup  Sample Menu Breakfast  Lunch                                     Supper Cranberry juice                    Beef broth                            Chicken broth Jell-O                                     Grape juice                           Apple juice Coffee or tea                        Jell-O                                      Popsicle                                                Coffee or tea                        Coffee or tea  _____________________________________________________________________   BRUSH YOUR TEETH MORNING OF SURGERY AND RINSE YOUR MOUTH OUT, NO CHEWING GUM CANDY OR MINTS.   How to Manage Your Diabetes Before and After Surgery  Why is it important to control my blood sugar before and after surgery? . Improving blood sugar levels before and after surgery helps healing and can limit problems. . A way of improving blood sugar control is eating a healthy diet by: o  Eating less sugar and carbohydrates o  Increasing activity/exercise o  Talking with your doctor about reaching your blood sugar goals . High blood sugars (greater than 180 mg/dL) can raise your risk of infections and slow your recovery, so you will need to focus on controlling your diabetes during the weeks before surgery. . Make sure that the doctor who takes care of your diabetes knows about your planned surgery including the date and location.  How do I manage my blood sugar before surgery? . Check your blood sugar at least 4 times a day, starting 2 days before surgery, to make sure that the level is not too high or low. o Check your blood sugar the morning of your surgery when you wake up and every 2 hours until you get to the Short Stay unit. . If your blood sugar is less than 70 mg/dL, you will need to treat for low blood sugar: o Do not take insulin. o Treat a low blood sugar (less than 70 mg/dL) with   cup of clear juice (cranberry or apple), 4 glucose tablets, OR glucose gel. o Recheck blood sugar in 15 minutes after treatment (to make sure it is greater than 70 mg/dL). If your blood sugar is not greater than 70 mg/dL on recheck, call [phone] for further instructions. Marland Kitchen  Report your blood sugar to the short stay nurse when you get to Short Stay.  . If you are admitted to the hospital after surgery: o Your blood sugar will be checked by the staff and you will probably be given insulin after surgery (instead of oral diabetes medicines) to make sure you have good blood sugar levels. o The goal for blood sugar control after surgery is 80-180 mg/dL.   WHAT DO I DO ABOUT MY DIABETES MEDICATION?  Marland Kitchen Do not take oral diabetes medicines (pills) the morning of surgery.  THE DAY BEFORE SURGERY, take  Metformin and glipizide as usual.  THE MORNING OF SURGERY:  DO NOT TAKE ANY DIABETIC MEDICATIONS DAY OF YOUR SURGERY                               You may not have any metal on your body including hair pins and              piercings  Do not wear jewelry,lotions, powders or perfumes, deodorant             Men may shave face and neck.   Do not bring valuables to the hospital. [place].  Contacts, dentures or bridgework may not be worn into surgery.  Leave suitcase in the car. After surgery it may be brought to your room.     Patients discharged the day of surgery will not be allowed to drive home. IF YOU ARE HAVING SURGERY AND GOING HOME THE SAME DAY, YOU MUST HAVE AN ADULT TO DRIVE YOU HOME AND BE WITH YOU FOR 24 HOURS. YOU MAY GO HOME BY TAXI OR UBER OR ORTHERWISE, BUT AN ADULT MUST ACCOMPANY YOU HOME AND STAY WITH YOU FOR 24 HOURS.  Name and phone number of your driver:  Special Instructions: N/A              Please read over the following fact sheets you were given: _____________________________________________________________________                             [facility] - Preparing for Surgery Before surgery, you can play an important role.  Because skin is not sterile, your skin needs to be as free of germs as possible.  You can reduce the number of germs on your skin by washing with CHG (chlorahexidine gluconate) soap before surgery.  CHG is an antiseptic cleaner which kills germs and bonds with the skin to continue killing germs even after washing. Please DO NOT use if you have an allergy to CHG or antibacterial soaps.  If your skin becomes reddened/irritated stop using the CHG and inform your nurse when you arrive at Short Stay. Do not shave (including legs and underarms) for at least 48 hours prior to the first CHG shower.  You may shave your face/neck. Please follow these instructions carefully:  1.  Shower with CHG Soap the night before surgery and the  morning of Surgery.  2.  If you choose to wash your hair, wash your hair first as usual with your  normal  shampoo.  3.  After you shampoo, rinse your hair and body thoroughly to remove the  shampoo.  4.  Use CHG as you would any other liquid soap.  You can apply chg directly  to the skin and wash                       Gently with a scrungie or clean washcloth.  5.  Apply the CHG Soap to your body ONLY FROM THE NECK DOWN.   Do not use on face/ open                           Wound or open sores. Avoid contact with eyes, ears mouth and genitals (private parts).                       Wash face,  Genitals (private parts) with your normal soap.             6.  Wash thoroughly, paying special attention to the area where your surgery  will be performed.  7.  Thoroughly rinse your body with warm water from the neck down.  8.  DO NOT shower/wash with your normal soap after using and rinsing off  the CHG Soap.                9.  Pat yourself dry with a clean towel.            10.  Wear clean pajamas.            11.  Place clean sheets on your bed the night of  your first shower and do not  sleep with pets. Day of Surgery : Do not apply any lotions/deodorants the morning of surgery.  Please wear clean clothes to the hospital/surgery center.  FAILURE TO FOLLOW THESE INSTRUCTIONS MAY RESULT IN THE CANCELLATION OF YOUR SURGERY PATIENT SIGNATURE_________________________________  NURSE SIGNATURE__________________________________  ________________________________________________________________________         Adam Phenix  An incentive spirometer is a tool that can help keep your lungs clear and active. This tool measures how well you are filling your lungs with each breath. Taking long deep breaths may help reverse or decrease the chance of developing breathing (pulmonary) problems (especially infection) following:  A long period of time when you are unable to move or be active. BEFORE THE PROCEDURE   If the spirometer includes an indicator to show your best effort, your nurse or respiratory therapist will set it to a desired goal.  If possible, sit up straight or lean slightly forward. Try not to slouch.  Hold the incentive spirometer in an upright position. INSTRUCTIONS FOR USE  1. Sit on the edge of your bed if possible, or sit up as far as you can in bed or on a chair. 2. Hold the incentive spirometer in an upright position. 3. Breathe out normally. 4. Place the mouthpiece in your mouth and seal your lips tightly around it. 5. Breathe in slowly and as deeply as possible, raising the piston or the ball toward the top of the column. 6. Hold your breath for 3-5 seconds or for as long as possible. Allow the piston or ball to fall to the bottom of the column. 7. Remove the mouthpiece from your mouth and breathe out normally. 8. Rest for a few seconds and repeat Steps 1 through 7 at least 10 times every 1-2 hours when you are awake. Take your time and take a few normal breaths between deep breaths. 9. The spirometer  may include an  indicator to show your best effort. Use the indicator as a goal to work toward during each repetition. 10. After each set of 10 deep breaths, practice coughing to be sure your lungs are clear. If you have an incision (the cut made at the time of surgery), support your incision when coughing by placing a pillow or rolled up towels firmly against it. Once you are able to get out of bed, walk around indoors and cough well. You may stop using the incentive spirometer when instructed by your caregiver.  RISKS AND COMPLICATIONS  Take your time so you do not get dizzy or light-headed.  If you are in pain, you may need to take or ask for pain medication before doing incentive spirometry. It is harder to take a deep breath if you are having pain. AFTER USE  Rest and breathe slowly and easily.  It can be helpful to keep track of a log of your progress. Your caregiver can provide you with a simple table to help with this. If you are using the spirometer at home, follow these instructions: [place] IF:   You are having difficultly using the spirometer.  You have trouble using the spirometer as often as instructed.  Your pain medication is not giving enough relief while using the spirometer.  You develop fever of 100.5 F (38.1 C) or higher. SEEK IMMEDIATE MEDICAL CARE IF:   You cough up bloody sputum that had not been present before.  You develop fever of 102 F (38.9 C) or greater.  You develop worsening pain at or near the incision site. MAKE SURE YOU:   Understand these instructions.  Will watch your condition.  Will get help right away if you are not doing well or get worse. Document Released: 09/21/2006 Document Revised: 08/03/2011 Document Reviewed: 11/22/2006 [facility] Patient Information 2014 [place], [place].   ________________________________________________________________________

## 2020-03-11 ENCOUNTER — Encounter (HOSPITAL_COMMUNITY)
Admission: RE | Admit: 2020-03-11 | Discharge: 2020-03-11 | Disposition: A | Discharge status: Home / Self Care | Payer: Medicare Other | Source: Ambulatory Visit | Attending: Surgery | Admitting: Surgery

## 2020-03-11 ENCOUNTER — Other Ambulatory Visit: Payer: Self-pay

## 2020-03-11 ENCOUNTER — Encounter (HOSPITAL_COMMUNITY): Payer: Self-pay

## 2020-03-11 DIAGNOSIS — Z01818 Encounter for other preprocedural examination: Secondary | ICD-10-CM | POA: Diagnosis not present

## 2020-03-11 HISTORY — DX: Bronchitis, not specified as acute or chronic: J40

## 2020-03-11 LAB — CBC
HCT: 47 % (ref 39.0–52.0)
Hemoglobin: 16.1 g/dL (ref 13.0–17.0)
MCH: 30.8 pg (ref 26.0–34.0)
MCHC: 34.3 g/dL (ref 30.0–36.0)
MCV: 89.9 fL (ref 80.0–100.0)
Platelets: 225 10*3/uL (ref 150–400)
RBC: 5.23 MIL/uL (ref 4.22–5.81)
RDW: 12.8 % (ref 11.5–15.5)
WBC: 6.7 10*3/uL (ref 4.0–10.5)
nRBC: 0 % (ref 0.0–0.2)

## 2020-03-11 LAB — BASIC METABOLIC PANEL
Anion gap: 10 (ref 5–15)
BUN: 17 mg/dL (ref 8–23)
CO2: 29 mmol/L (ref 22–32)
Calcium: 9.4 mg/dL (ref 8.9–10.3)
Chloride: 97 mmol/L — ABNORMAL LOW (ref 98–111)
Creatinine, Ser: 0.82 mg/dL (ref 0.61–1.24)
GFR, Estimated: >60 mL/min (ref >60)
Glucose, Bld: 227 mg/dL — ABNORMAL HIGH (ref 70–99)
Potassium: 4.2 mmol/L (ref 3.5–5.1)
Sodium: 136 mmol/L (ref 135–145)

## 2020-03-11 LAB — GLUCOSE, CAPILLARY: Glucose-Capillary: 211 mg/dL — ABNORMAL HIGH (ref 70–99)

## 2020-03-11 LAB — HEMOGLOBIN A1C
Hgb A1c MFr Bld: 6.6 % — ABNORMAL HIGH (ref 4.8–5.6)
Mean Plasma Glucose: 143 mg/dL

## 2020-03-11 NOTE — Progress Notes (Addendum)
COVID Vaccine Completed: Yes Date COVID Vaccine completed: 07/28/19 COVID vaccine manufacturer:     Moderna    PCP - Dr. Kathryne Eriksson. LOV: 01/30/20 Cardiologist -   Chest x-ray -  EKG - 01/15/20 Stress Test -  ECHO -  Cardiac Cath -  Pacemaker/ICD device last checked:  Sleep Study -  CPAP -   Fasting Blood Sugar -  140's Checks Blood Sugar __2___ times a week  Blood Thinner Instructions: Aspirin Instructions: Last Dose:  Anesthesia review: Hx: HTN,DIA  Patient denies shortness of breath, fever, cough and chest pain at PAT appointment   Patient verbalized understanding of instructions that were given to them at the PAT appointment. Patient was also instructed that they will need to review over the PAT instructions again at home before surgery.

## 2020-03-12 ENCOUNTER — Other Ambulatory Visit (HOSPITAL_COMMUNITY)
Admission: RE | Admit: 2020-03-12 | Discharge: 2020-03-12 | Disposition: A | Discharge status: Home / Self Care | Payer: Medicare Other | Source: Ambulatory Visit | Attending: Surgery | Admitting: Surgery

## 2020-03-12 DIAGNOSIS — Z01812 Encounter for preprocedural laboratory examination: Secondary | ICD-10-CM | POA: Insufficient documentation

## 2020-03-12 DIAGNOSIS — Z20822 Contact with and (suspected) exposure to covid-19: Secondary | ICD-10-CM | POA: Insufficient documentation

## 2020-03-12 LAB — SARS CORONAVIRUS 2 (TAT 6-24 HRS): SARS Coronavirus 2: NEGATIVE

## 2020-03-14 ENCOUNTER — Other Ambulatory Visit: Payer: Self-pay

## 2020-03-14 ENCOUNTER — Ambulatory Visit (AMBULATORY_SURGERY_CENTER): Payer: Medicare Other | Admitting: Gastroenterology

## 2020-03-14 ENCOUNTER — Encounter: Payer: Self-pay | Admitting: Gastroenterology

## 2020-03-14 VITALS — BP 132/61 | HR 55 | Temp 98.0°F | Resp 11 | Ht 74.0 in | Wt 273.4 lb

## 2020-03-14 DIAGNOSIS — D127 Benign neoplasm of rectosigmoid junction: Secondary | ICD-10-CM

## 2020-03-14 DIAGNOSIS — D123 Benign neoplasm of transverse colon: Secondary | ICD-10-CM | POA: Diagnosis not present

## 2020-03-14 DIAGNOSIS — Z86010 Personal history of colonic polyps: Secondary | ICD-10-CM

## 2020-03-14 MED ORDER — SODIUM CHLORIDE 0.9 % IV SOLN: 500.0000 mL | Freq: Once | INTRAVENOUS | Status: DC

## 2020-03-14 NOTE — Patient Instructions (Signed)
Please read handouts provided. Continue present medications. Await pathology results. Diet per surgical instructions for appendectomy tomorrow.     YOU HAD AN ENDOSCOPIC PROCEDURE TODAY AT [facility]:   Refer to the procedure report that was given to you for any specific questions about what was found during the examination.  If the procedure report does not answer your questions, please call your gastroenterologist to clarify.  If you requested that your care partner not be given the details of your procedure findings, then the procedure report has been included in a sealed envelope for you to review at your convenience later.  YOU SHOULD EXPECT: Some feelings of bloating in the abdomen. Passage of more gas than usual.  Walking can help get rid of the air that was put into your GI tract during the procedure and reduce the bloating. If you had a lower endoscopy (such as a colonoscopy or flexible sigmoidoscopy) you may notice spotting of blood in your stool or on the toilet paper. If you underwent a bowel prep for your procedure, you may not have a normal bowel movement for a few days.  Please Note:  You might notice some irritation and congestion in your nose or some drainage.  This is from the oxygen used during your procedure.  There is no need for concern and it should clear up in a day or so.  SYMPTOMS TO REPORT IMMEDIATELY:   Following lower endoscopy (colonoscopy or flexible sigmoidoscopy):  Excessive amounts of blood in the stool  Significant tenderness or worsening of abdominal pains  Swelling of the abdomen that is new, acute  Fever of 100F or higher   For urgent or emergent issues, a gastroenterologist can be reached at any hour by calling [phone]. Do not use MyChart messaging for urgent concerns.    DIET:  Drink plenty of fluids but you should avoid alcoholic beverages for 24 hours.  ACTIVITY:  You should plan to take it easy for the rest of today  and you should NOT DRIVE or use heavy machinery until tomorrow (because of the sedation medicines used during the test).    FOLLOW UP: Our staff will call the number listed on your records 48-72 hours following your procedure to check on you and address any questions or concerns that you may have regarding the information given to you following your procedure. If we do not reach you, we will leave a message.  We will attempt to reach you two times.  During this call, we will ask if you have developed any symptoms of COVID 19. If you develop any symptoms (ie: fever, flu-like symptoms, shortness of breath, cough etc.) before then, please call [phone].  If you test positive for Covid 19 in the 2 weeks post procedure, please call and report this information to [place].    If any biopsies were taken you will be contacted by phone or by letter within the next 1-3 weeks.  Please call us at [phone] if you have not heard about the biopsies in 3 weeks.    SIGNATURES/CONFIDENTIALITY: You and/or your care partner have signed paperwork which will be entered into your electronic medical record.  These signatures attest to the fact that that the information above on your After Visit Summary has been reviewed and is understood.  Full responsibility of the confidentiality of this discharge information lies with you and/or your care-partner.

## 2020-03-14 NOTE — Progress Notes (Signed)
A/ox3, pleased with MAC, report to RN 

## 2020-03-14 NOTE — Progress Notes (Signed)
Pt's states no medical or surgical changes since previsit or office visit.   VS taken by CW 

## 2020-03-14 NOTE — Progress Notes (Signed)
Called to room to assist during endoscopic procedure.  Patient ID and intended procedure confirmed with present staff. Received instructions for my participation in the procedure from the performing physician.  

## 2020-03-14 NOTE — Op Note (Signed)
[place] Patient Name: Albert Mayer Procedure Date: 03/14/2020 2:53 PM MRN: [id number] Endoscopist: Albert Mayer , MD Age: 64 Referring MD:  Date of Birth: June 17, 1955 Gender: Male Account #: [id number] Procedure:                Colonoscopy Indications:              High risk colon cancer surveillance: Personal                            history of colonic polyps (multiple adenomas, one                            large with HGD in 2017), brother with colon cancer                            age 48, history of perforated appendix has since                            healed, pending appendectomy tomorrow Medicines:                Monitored Anesthesia Care Procedure:                Pre-Anesthesia Assessment:                           - Prior to the procedure, a History and Physical                            was performed, and patient medications and                            allergies were reviewed. The patient's tolerance of                            previous anesthesia was also reviewed. The risks                            and benefits of the procedure and the sedation                            options and risks were discussed with the patient.                            All questions were answered, and informed consent                            was obtained. Prior Anticoagulants: The patient has                            taken no previous anticoagulant or antiplatelet                            agents. ASA Grade Assessment: II - A patient with  mild systemic disease. After reviewing the risks                            and benefits, the patient was deemed in                            satisfactory condition to undergo the procedure.                           After obtaining informed consent, the colonoscope                            was passed under direct vision. Throughout the                            procedure, the patient's  blood pressure, pulse, and                            oxygen saturations were monitored continuously. The                            Colonoscope was introduced through the anus and                            advanced to the the terminal ileum, with                            identification of the appendiceal orifice and IC                            valve. The colonoscopy was performed without                            difficulty. The patient tolerated the procedure                            well. The quality of the bowel preparation was                            adequate. The terminal ileum, ileocecal valve,                            appendiceal orifice, and rectum were photographed. Scope In: 3:08:10 PM Scope Out: 3:29:10 PM Scope Withdrawal Time: 0 hours 18 minutes 40 seconds  Total Procedure Duration: 0 hours 21 minutes 0 seconds  Findings:                 The perianal and digital rectal examinations were                            normal.                           The terminal ileum appeared normal.  A 4 mm polyp was found in the transverse colon. The                            polyp was sessile. The polyp was removed with a                            cold snare. Resection and retrieval were complete.                           A 3 mm polyp was found in the sigmoid colon. The                            polyp was sessile. The polyp was removed with a                            cold snare. Resection and retrieval were complete.                           Multiple medium-mouthed diverticula were found in                            the sigmoid colon.                           Internal hemorrhoids were found during                            retroflexion. The hemorrhoids were small.                           The exam was otherwise without abnormality.                            Appendiceal orifice appeared normal. Complications:            No immediate  complications. Estimated blood loss:                            Minimal. Estimated Blood Loss:     Estimated blood loss was minimal. Impression:               - The examined portion of the ileum was normal.                           - One 4 mm polyp in the transverse colon, removed                            with a cold snare. Resected and retrieved.                           - One 3 mm polyp in the sigmoid colon, removed with                            a cold snare. Resected and retrieved.                           -  Diverticulosis in the sigmoid colon.                           - Internal hemorrhoids.                           - The examination was otherwise normal. Recommendation:           - Patient has a contact number available for                            emergencies. The signs and symptoms of potential                            delayed complications were discussed with the                            patient. Return to normal activities tomorrow.                            Written discharge instructions were provided to the                            patient.                           - Diet per surgical instructions for appendectomy                            tomorrow                           - Continue present medications.                           - Await pathology results.                           - Repeat colonoscopy in 5 years given history of                            advanced adenoma with HGD and family history of                            colon cancer                           - Proceed with appendectomy as scheduled tomorrow                            with Dr. [name], MD 03/14/2020 3:35:01 PM This report has been signed electronically.

## 2020-03-15 ENCOUNTER — Inpatient Hospital Stay (HOSPITAL_COMMUNITY): Payer: Medicare Other | Admitting: Anesthesiology

## 2020-03-15 ENCOUNTER — Encounter (HOSPITAL_COMMUNITY)
Admission: RE | Disposition: A | Discharge status: Home / Self Care | Payer: Self-pay | Source: Home / Self Care | Attending: Surgery

## 2020-03-15 ENCOUNTER — Ambulatory Visit (HOSPITAL_COMMUNITY)
Admission: RE | Admit: 2020-03-15 | Discharge: 2020-03-15 | Disposition: A | Discharge status: Home / Self Care | Payer: Medicare Other | Attending: Surgery | Admitting: Surgery

## 2020-03-15 ENCOUNTER — Encounter (HOSPITAL_COMMUNITY): Payer: Self-pay | Admitting: Surgery

## 2020-03-15 DIAGNOSIS — Z9049 Acquired absence of other specified parts of digestive tract: Secondary | ICD-10-CM | POA: Insufficient documentation

## 2020-03-15 DIAGNOSIS — E669 Obesity, unspecified: Secondary | ICD-10-CM | POA: Insufficient documentation

## 2020-03-15 DIAGNOSIS — K3533 Acute appendicitis with perforation and localized peritonitis, with abscess: Secondary | ICD-10-CM | POA: Insufficient documentation

## 2020-03-15 DIAGNOSIS — Z6836 Body mass index (BMI) 36.0-36.9, adult: Secondary | ICD-10-CM | POA: Diagnosis not present

## 2020-03-15 DIAGNOSIS — Z885 Allergy status to narcotic agent status: Secondary | ICD-10-CM | POA: Insufficient documentation

## 2020-03-15 DIAGNOSIS — Z86010 Personal history of colonic polyps: Secondary | ICD-10-CM | POA: Insufficient documentation

## 2020-03-15 HISTORY — PX: LAPAROSCOPIC APPENDECTOMY: SHX408

## 2020-03-15 LAB — GLUCOSE, CAPILLARY: Glucose-Capillary: 137 mg/dL — ABNORMAL HIGH (ref 70–99)

## 2020-03-15 SURGERY — APPENDECTOMY, LAPAROSCOPIC
Anesthesia: General

## 2020-03-15 MED ORDER — LACTATED RINGERS IV SOLN: INTRAVENOUS | Status: DC

## 2020-03-15 MED ORDER — GABAPENTIN 300 MG PO CAPS
300.0000 mg | ORAL_CAPSULE | ORAL | Status: AC
  Administered 2020-03-15: 300 mg via ORAL
  Filled 2020-03-15: qty 1

## 2020-03-15 MED ORDER — BUPIVACAINE LIPOSOME 1.3 % IJ SUSP
20.0000 mL | Freq: Once | INTRAMUSCULAR | Status: DC
  Filled 2020-03-15: qty 20

## 2020-03-15 MED ORDER — DEXAMETHASONE SODIUM PHOSPHATE 10 MG/ML IJ SOLN
INTRAMUSCULAR | Status: AC
  Filled 2020-03-15: qty 1

## 2020-03-15 MED ORDER — FENTANYL CITRATE (PF) 100 MCG/2ML IJ SOLN
INTRAMUSCULAR | Status: AC
  Filled 2020-03-15: qty 2

## 2020-03-15 MED ORDER — 0.9 % SODIUM CHLORIDE (POUR BTL) OPTIME
TOPICAL | Status: DC | PRN
  Administered 2020-03-15: 1000 mL

## 2020-03-15 MED ORDER — LIDOCAINE 2% (20 MG/ML) 5 ML SYRINGE
INTRAMUSCULAR | Status: AC
  Filled 2020-03-15: qty 5

## 2020-03-15 MED ORDER — DEXAMETHASONE SODIUM PHOSPHATE 10 MG/ML IJ SOLN
INTRAMUSCULAR | Status: DC | PRN
  Administered 2020-03-15: 8 mg via INTRAVENOUS

## 2020-03-15 MED ORDER — HYDROMORPHONE HCL 1 MG/ML IJ SOLN: 0.2500 mg | INTRAMUSCULAR | Status: DC | PRN

## 2020-03-15 MED ORDER — SUGAMMADEX SODIUM 200 MG/2ML IV SOLN
INTRAVENOUS | Status: DC | PRN
  Administered 2020-03-15: 250 mg via INTRAVENOUS

## 2020-03-15 MED ORDER — ROCURONIUM BROMIDE 10 MG/ML (PF) SYRINGE
PREFILLED_SYRINGE | INTRAVENOUS | Status: AC
  Filled 2020-03-15: qty 10

## 2020-03-15 MED ORDER — ROCURONIUM BROMIDE 10 MG/ML (PF) SYRINGE
PREFILLED_SYRINGE | INTRAVENOUS | Status: DC | PRN
  Administered 2020-03-15 (×2): 10 mg via INTRAVENOUS
  Administered 2020-03-15: 50 mg via INTRAVENOUS

## 2020-03-15 MED ORDER — SUCCINYLCHOLINE CHLORIDE 200 MG/10ML IV SOSY
PREFILLED_SYRINGE | INTRAVENOUS | Status: AC
  Filled 2020-03-15: qty 10

## 2020-03-15 MED ORDER — BUPIVACAINE HCL (PF) 0.25 % IJ SOLN
INTRAMUSCULAR | Status: AC
  Filled 2020-03-15: qty 30

## 2020-03-15 MED ORDER — MEPERIDINE HCL 50 MG/ML IJ SOLN: 6.2500 mg | INTRAMUSCULAR | Status: DC | PRN

## 2020-03-15 MED ORDER — OXYCODONE HCL 5 MG PO TABS: 5.0000 mg | ORAL_TABLET | Freq: Once | ORAL | Status: DC | PRN

## 2020-03-15 MED ORDER — LIDOCAINE 2% (20 MG/ML) 5 ML SYRINGE
INTRAMUSCULAR | Status: DC | PRN
  Administered 2020-03-15: 80 mg via INTRAVENOUS
  Administered 2020-03-15: 40 mg via INTRAVENOUS

## 2020-03-15 MED ORDER — ENSURE PRE-SURGERY PO LIQD: 296.0000 mL | Freq: Once | ORAL | Status: DC

## 2020-03-15 MED ORDER — OXYCODONE HCL 5 MG/5ML PO SOLN: 5.0000 mg | Freq: Once | ORAL | Status: DC | PRN

## 2020-03-15 MED ORDER — METRONIDAZOLE 500 MG PO TABS: 1000.0000 mg | ORAL_TABLET | ORAL | Status: DC

## 2020-03-15 MED ORDER — ACETAMINOPHEN 500 MG PO TABS
1000.0000 mg | ORAL_TABLET | ORAL | Status: AC
  Administered 2020-03-15: 1000 mg via ORAL
  Filled 2020-03-15: qty 2

## 2020-03-15 MED ORDER — BUPIVACAINE LIPOSOME 1.3 % IJ SUSP
INTRAMUSCULAR | Status: DC | PRN
  Administered 2020-03-15: 20 mL

## 2020-03-15 MED ORDER — CHLORHEXIDINE GLUCONATE 0.12 % MT SOLN
15.0000 mL | Freq: Once | OROMUCOSAL | Status: AC
  Administered 2020-03-15: 15 mL via OROMUCOSAL

## 2020-03-15 MED ORDER — ONDANSETRON HCL 4 MG/2ML IJ SOLN
INTRAMUSCULAR | Status: AC
  Filled 2020-03-15: qty 2

## 2020-03-15 MED ORDER — ENOXAPARIN SODIUM 40 MG/0.4ML ~~LOC~~ SOLN
40.0000 mg | Freq: Once | SUBCUTANEOUS | Status: AC
  Administered 2020-03-15: 40 mg via SUBCUTANEOUS
  Filled 2020-03-15: qty 0.4

## 2020-03-15 MED ORDER — MIDAZOLAM HCL 2 MG/2ML IJ SOLN
INTRAMUSCULAR | Status: AC
  Filled 2020-03-15: qty 2

## 2020-03-15 MED ORDER — TRAMADOL HCL 50 MG PO TABS: 50.0000 mg | ORAL_TABLET | Freq: Four times a day (QID) | ORAL | 0 refills | Status: AC | PRN

## 2020-03-15 MED ORDER — PROPOFOL 10 MG/ML IV BOLUS
INTRAVENOUS | Status: DC | PRN
  Administered 2020-03-15: 200 mg via INTRAVENOUS

## 2020-03-15 MED ORDER — ENSURE PRE-SURGERY PO LIQD
592.0000 mL | Freq: Once | ORAL | Status: DC
  Filled 2020-03-15: qty 592

## 2020-03-15 MED ORDER — BUPIVACAINE-EPINEPHRINE 0.5% -1:200000 IJ SOLN
INTRAMUSCULAR | Status: AC
  Filled 2020-03-15: qty 1

## 2020-03-15 MED ORDER — ONDANSETRON HCL 4 MG/2ML IJ SOLN: 4.0000 mg | Freq: Once | INTRAMUSCULAR | Status: DC | PRN

## 2020-03-15 MED ORDER — ORAL CARE MOUTH RINSE: 15.0000 mL | Freq: Once | OROMUCOSAL | Status: AC

## 2020-03-15 MED ORDER — SODIUM CHLORIDE 0.9 % IV SOLN
2.0000 g | INTRAVENOUS | Status: AC
  Administered 2020-03-15: 2 g via INTRAVENOUS
  Filled 2020-03-15: qty 2

## 2020-03-15 MED ORDER — KETOROLAC TROMETHAMINE 30 MG/ML IJ SOLN
INTRAMUSCULAR | Status: AC
  Filled 2020-03-15: qty 1

## 2020-03-15 MED ORDER — BUPIVACAINE-EPINEPHRINE 0.25% -1:200000 IJ SOLN
INTRAMUSCULAR | Status: DC | PRN
  Administered 2020-03-15: 50 mL

## 2020-03-15 MED ORDER — BISACODYL 5 MG PO TBEC: 20.0000 mg | DELAYED_RELEASE_TABLET | Freq: Once | ORAL | Status: DC

## 2020-03-15 MED ORDER — MIDAZOLAM HCL 2 MG/2ML IJ SOLN
INTRAMUSCULAR | Status: DC | PRN
  Administered 2020-03-15: 2 mg via INTRAVENOUS

## 2020-03-15 MED ORDER — FENTANYL CITRATE (PF) 100 MCG/2ML IJ SOLN
INTRAMUSCULAR | Status: DC | PRN
  Administered 2020-03-15: 50 ug via INTRAVENOUS
  Administered 2020-03-15: 25 ug via INTRAVENOUS
  Administered 2020-03-15: 50 ug via INTRAVENOUS
  Administered 2020-03-15: 25 ug via INTRAVENOUS

## 2020-03-15 MED ORDER — PROPOFOL 10 MG/ML IV BOLUS
INTRAVENOUS | Status: AC
  Filled 2020-03-15: qty 20

## 2020-03-15 MED ORDER — SUCCINYLCHOLINE CHLORIDE 200 MG/10ML IV SOSY
PREFILLED_SYRINGE | INTRAVENOUS | Status: DC | PRN
  Administered 2020-03-15: 120 mg via INTRAVENOUS

## 2020-03-15 MED ORDER — POLYETHYLENE GLYCOL 3350 17 GM/SCOOP PO POWD: 1.0000 | Freq: Once | ORAL | Status: DC

## 2020-03-15 MED ORDER — KETOROLAC TROMETHAMINE 30 MG/ML IJ SOLN
INTRAMUSCULAR | Status: DC | PRN
  Administered 2020-03-15: 15 mg via INTRAVENOUS

## 2020-03-15 MED ORDER — SUGAMMADEX SODIUM 500 MG/5ML IV SOLN
INTRAVENOUS | Status: AC
  Filled 2020-03-15: qty 5

## 2020-03-15 MED ORDER — LACTATED RINGERS IR SOLN
Status: DC | PRN
  Administered 2020-03-15: 1000 mL

## 2020-03-15 MED ORDER — ALBUMIN HUMAN 5 % IV SOLN
INTRAVENOUS | Status: AC
  Filled 2020-03-15: qty 250

## 2020-03-15 MED ORDER — NEOMYCIN SULFATE 500 MG PO TABS: 1000.0000 mg | ORAL_TABLET | ORAL | Status: DC

## 2020-03-15 MED ORDER — ALVIMOPAN 12 MG PO CAPS
12.0000 mg | ORAL_CAPSULE | ORAL | Status: AC
  Administered 2020-03-15: 12 mg via ORAL
  Filled 2020-03-15: qty 1

## 2020-03-15 MED ORDER — ALBUMIN HUMAN 5 % IV SOLN: INTRAVENOUS | Status: DC | PRN

## 2020-03-15 MED ORDER — ONDANSETRON HCL 4 MG/2ML IJ SOLN
INTRAMUSCULAR | Status: DC | PRN
  Administered 2020-03-15: 4 mg via INTRAVENOUS

## 2020-03-15 SURGICAL SUPPLY — 54 items
APL PRP STRL LF DISP 70% ISPRP (MISCELLANEOUS) ×1
APPLIER CLIP 5 13 M/L LIGAMAX5 (MISCELLANEOUS)
APPLIER CLIP ROT 10 11.4 M/L (STAPLE)
APR CLP MED LRG 11.4X10 (STAPLE)
APR CLP MED LRG 5 ANG JAW (MISCELLANEOUS)
BAG SPEC RTRVL 10 TROC 200 (ENDOMECHANICALS) ×1
CABLE HIGH FREQUENCY MONO STRZ (ELECTRODE) IMPLANT
CHLORAPREP W/TINT 26 (MISCELLANEOUS) ×3 IMPLANT
CLIP APPLIE 5 13 M/L LIGAMAX5 (MISCELLANEOUS) IMPLANT
CLIP APPLIE ROT 10 11.4 M/L (STAPLE) IMPLANT
COVER SURGICAL LIGHT HANDLE (MISCELLANEOUS) ×3 IMPLANT
COVER WAND RF STERILE (DRAPES) IMPLANT
DECANTER SPIKE VIAL GLASS SM (MISCELLANEOUS) IMPLANT
DEVICE TROCAR PUNCTURE CLOSURE (ENDOMECHANICALS) ×3 IMPLANT
DRAPE LAPAROSCOPIC ABDOMINAL (DRAPES) ×3 IMPLANT
DRAPE WARM FLUID 44X44 (DRAPES) ×3 IMPLANT
DRSG TEGADERM 2-3/8X2-3/4 SM (GAUZE/BANDAGES/DRESSINGS) ×6 IMPLANT
DRSG TEGADERM 4X4.75 (GAUZE/BANDAGES/DRESSINGS) ×3 IMPLANT
ELECT REM PT RETURN 15FT ADLT (MISCELLANEOUS) ×3 IMPLANT
ENDOLOOP SUT PDS II  0 18 (SUTURE)
ENDOLOOP SUT PDS II 0 18 (SUTURE) IMPLANT
GAUZE SPONGE 2X2 8PLY STRL LF (GAUZE/BANDAGES/DRESSINGS) ×1 IMPLANT
GLOVE ECLIPSE 8.0 STRL XLNG CF (GLOVE) ×3 IMPLANT
GLOVE INDICATOR 8.0 STRL GRN (GLOVE) ×3 IMPLANT
GOWN STRL REUS W/TWL XL LVL3 (GOWN DISPOSABLE) ×6 IMPLANT
IRRIG SUCT STRYKERFLOW 2 WTIP (MISCELLANEOUS) ×3
IRRIGATION SUCT STRKRFLW 2 WTP (MISCELLANEOUS) ×1 IMPLANT
KIT BASIN OR (CUSTOM PROCEDURE TRAY) ×3 IMPLANT
KIT TURNOVER KIT A (KITS) ×3 IMPLANT
PAD POSITIONING PINK XL (MISCELLANEOUS) ×3 IMPLANT
PENCIL SMOKE EVACUATOR (MISCELLANEOUS) IMPLANT
POUCH RETRIEVAL ECOSAC 10 (ENDOMECHANICALS) ×1 IMPLANT
POUCH RETRIEVAL ECOSAC 10MM (ENDOMECHANICALS) ×2
RELOAD STAPLER BLUE 60MM (STAPLE) ×1 IMPLANT
RELOAD STAPLER GREEN 60MM (STAPLE) IMPLANT
SCISSORS LAP 5X35 DISP (ENDOMECHANICALS) ×3 IMPLANT
SET TUBE SMOKE EVAC HIGH FLOW (TUBING) ×3 IMPLANT
SHEARS HARMONIC ACE PLUS 36CM (ENDOMECHANICALS) ×3 IMPLANT
SLEEVE XCEL OPT CAN 5 100 (ENDOMECHANICALS) ×3 IMPLANT
SPONGE GAUZE 2X2 STER 10/PKG (GAUZE/BANDAGES/DRESSINGS) ×2
STAPLE ECHEON FLEX 60 POW ENDO (STAPLE) ×3 IMPLANT
STAPLER RELOAD BLUE 60MM (STAPLE) ×3
STAPLER RELOAD GREEN 60MM (STAPLE)
SUT MNCRL AB 4-0 PS2 18 (SUTURE) ×3 IMPLANT
SUT PDS AB 0 CT1 36 (SUTURE) IMPLANT
SUT PDS AB 1 CT1 27 (SUTURE) IMPLANT
SUT SILK 2 0 SH (SUTURE) IMPLANT
SUT VIC AB 2-0 SH 27 (SUTURE) ×3
SUT VIC AB 2-0 SH 27X BRD (SUTURE) ×1 IMPLANT
TOWEL OR 17X26 10 PK STRL BLUE (TOWEL DISPOSABLE) ×3 IMPLANT
TRAY FOLEY MTR SLVR 16FR STAT (SET/KITS/TRAYS/PACK) ×3 IMPLANT
TRAY LAPAROSCOPIC (CUSTOM PROCEDURE TRAY) ×3 IMPLANT
TROCAR BLADELESS OPT 5 100 (ENDOMECHANICALS) ×3 IMPLANT
TROCAR XCEL 12X100 BLDLESS (ENDOMECHANICALS) ×3 IMPLANT

## 2020-03-15 NOTE — Anesthesia Postprocedure Evaluation (Signed)
Anesthesia Post Note  Patient: Albert Mayer  Procedure(s) Performed: APPENDECTOMY LAPAROSCOPIC (N/A )     Patient location during evaluation: PACU Anesthesia Type: General Level of consciousness: sedated and patient cooperative Pain management: pain level controlled Vital Signs Assessment: post-procedure vital signs reviewed and stable Respiratory status: spontaneous breathing Cardiovascular status: stable Anesthetic complications: no   No complications documented.  Last Vitals:  Vitals:   03/15/20 1632 03/15/20 1705  BP: (!) 165/68 (!) 154/80  Pulse: 71 68  Resp: 12 18  Temp:  36.7 C  SpO2: 98% 95%    Last Pain:  Vitals:   03/15/20 1705  TempSrc:   PainSc: 0-No pain                 Nolon Nations

## 2020-03-15 NOTE — Transfer of Care (Signed)
Immediate Anesthesia Transfer of Care Note  Patient: Albert Mayer  Procedure(s) Performed: APPENDECTOMY LAPAROSCOPIC (N/A )  Patient Location: PACU  Anesthesia Type:General  Level of Consciousness: awake, alert  and oriented  Airway & Oxygen Therapy: Patient Spontanous Breathing and Patient connected to face mask oxygen  Post-op Assessment: Report given to RN, Post -op Vital signs reviewed and stable and Patient moving all extremities X 4  Post vital signs: Reviewed and stable  Last Vitals:  Vitals Value Taken Time  BP    Temp    Pulse    Resp    SpO2      Last Pain:  Vitals:   03/15/20 1332  TempSrc:   PainSc: 0-No pain         Complications: No complications documented.

## 2020-03-15 NOTE — Interval H&P Note (Signed)
History and Physical Interval Note:  03/15/2020 1:25 PM  Albert Mayer  has presented today for surgery, with the diagnosis of APPENDICITIS WITH ABSCESS , HISTORY OF COLON POLYPS.  The various methods of treatment have been discussed with the patient and family. After consideration of risks, benefits and other options for treatment, the patient has consented to  Procedure(s): APPENDECTOMY LAPAROSCOPIC (N/A) POSSIBLE ILEOCECETOMY (N/A) as a surgical intervention.  The patient's history has been reviewed, patient examined, no change in status, stable for surgery.  I have reviewed the patient's chart and labs.  Questions were answered to the patient's satisfaction.    I have re-reviewed the the patient's records, history, medications, and allergies.  I have re-examined the patient.  I again discussed intraoperative plans and goals of post-operative recovery.  The patient agrees to proceed.  Brevyn Ring  [birth year] [id number]  Patient Care Team: Christain Sacramento, MD as PCP - General (Family Medicine) Armbruster, Carlota Raspberry, MD as Consulting Physician (Gastroenterology) Michael Boston, MD as Consulting Physician (General Surgery)  Patient Active Problem List   Diagnosis Date Noted   Leukocytosis    Obesity, Class III, BMI 40-49.9 (morbid obesity) ([place])    Hyperglycemia 01/12/2020   Severe obesity (BMI 35.0-35.9 with comorbidity) ([place]) 01/12/2020   Acute appendicitis with perforation and peritoneal abscess 01/12/2020   Hypokalemia 01/12/2020   Sepsis ([place]) 01/12/2020   Severe sepsis with acute organ dysfunction ([place]) 01/12/2020   Nonspecific pain in the lumbar region 08/02/2018   Degeneration of lumbar intervertebral disc 08/02/2018   Type 2 diabetes mellitus ([place]) 08/02/2018   Low back pain 08/02/2018   Kidney stone on left side 05/04/2018   Pure hypercholesterolemia 11/01/2017   Neuropathy of right upper extremity 12/31/2014   Primary hypertension 12/31/2014   Right leg pain 12/31/2014     Past Medical History:  Diagnosis Date   Allergy    seasonal   Arthritis    Bronchitis    Cataract    rt eye- not ready for surgery   Diabetes mellitus without complication ([place])    Hyperlipidemia    Hypertension     Past Surgical History:  Procedure Laterality Date   appendix abscess     COLONOSCOPY  2017   IR RADIOLOGIST EVAL & MGMT  01/23/2020   WISDOM TOOTH EXTRACTION      Social History   Socioeconomic History   Marital status: Widowed    Spouse name: Not on file   Number of children: Not on file   Years of education: Not on file   Highest education level: Not on file  Occupational History   Not on file  Tobacco Use   Smoking status: Never Smoker   Smokeless tobacco: Never Used  Vaping Use   Vaping Use: Never used  Substance and Sexual Activity   Alcohol use: Yes    Comment: occas.   Drug use: No   Sexual activity: Not on file  Other Topics Concern   Not on file  Social History Narrative   Not on file   Social Determinants of Health   Financial Resource Strain:    Difficulty of Paying Living Expenses: Not on file  Food Insecurity:    Worried About [place] in the Last Year: Not on file   Ran Out of Food in the Last Year: Not on file  Transportation Needs:    Lack of Transportation (Medical): Not on file   Lack of Transportation (Non-Medical): Not on file  Physical Activity:  Days of Exercise per Week: Not on file   Minutes of Exercise per Session: Not on file  Stress:    Feeling of Stress : Not on file  Social Connections:    Frequency of Communication with Friends and Family: Not on file   Frequency of Social Gatherings with Friends and Family: Not on file   Attends Religious Services: Not on file   Active Member of Clubs or Organizations: Not on file   Attends Archivist Meetings: Not on file   Marital Status: Not on file  Intimate Partner Violence:    Fear of Current or Ex-Partner: Not on file   Emotionally  Abused: Not on file   Physically Abused: Not on file   Sexually Abused: Not on file    Family History  Problem Relation Age of Onset   Breast cancer Mother    Liver cancer Mother    Melanoma Father    Leukemia Father    Colon cancer Brother 75   Colon polyps Neg Hx    Esophageal cancer Neg Hx    Rectal cancer Neg Hx    Stomach cancer Neg Hx     Medications Prior to Admission  Medication Sig Dispense Refill Last Dose   acetaminophen (TYLENOL) 500 MG tablet Take 1-2 tablets (500-1,000 mg total) by mouth every 8 (eight) hours as needed for mild pain, moderate pain, fever or headache.      amLODipine (NORVASC) 5 MG tablet Take 5 mg by mouth daily.      aspirin EC 81 MG tablet Take 81 mg by mouth daily.      atorvastatin (LIPITOR) 40 MG tablet Take 40 mg by mouth daily.      Dextromethorphan-guaiFENesin (MUCINEX DM MAXIMUM STRENGTH) 60-1200 MG TB12 Take 1 tablet by mouth daily as needed (Congestion).      glipiZIDE (GLUCOTROL XL) 10 MG 24 hr tablet Take 10 mg by mouth daily.      lisinopril-hydrochlorothiazide (PRINZIDE,ZESTORETIC) 20-12.5 MG tablet Take 1 tablet by mouth daily.      metFORMIN (GLUCOPHAGE-XR) 500 MG 24 hr tablet Take 500 mg by mouth 2 (two) times daily.      methocarbamol (ROBAXIN) 500 MG tablet Take 500 mg by mouth every 8 (eight) hours as needed for muscle spasms.       Multiple Vitamin (MULTIVITAMIN WITH MINERALS) TABS tablet Take 1 tablet by mouth daily. (Patient taking differently: Take 1 tablet by mouth daily. Centrum)      saccharomyces boulardii (FLORASTOR) 250 MG capsule Take 1 capsule (250 mg total) by mouth 2 (two) times daily. 60 capsule 0    senna-docusate (SENOKOT-S) 8.6-50 MG tablet Take 1 tablet by mouth 2 (two) times daily. 15 tablet 0    Blood Glucose Monitoring Suppl (ACCU-CHEK GUIDE) w/Device KIT See admin instructions.      pantoprazole (PROTONIX) 40 MG tablet Take 1 tablet (40 mg total) by mouth daily. 30 tablet 0     Current Facility-Administered  Medications  Medication Dose Route Frequency Provider Last Rate Last Admin   acetaminophen (TYLENOL) tablet 1,000 mg  1,000 mg Oral On Call to OR Michael Boston, MD       alvimopan (ENTEREG) capsule 12 mg  12 mg Oral On Call to OR Michael Boston, MD       bupivacaine liposome (EXPAREL) 1.3 % injection 266 mg  20 mL Infiltration Once Michael Boston, MD       cefoTEtan (CEFOTAN) 2 g in sodium chloride 0.9 % 100 mL  IVPB  2 g Intravenous On Call to OR Michael Boston, MD       chlorhexidine (PERIDEX) 0.12 % solution 15 mL  15 mL Mouth/Throat Once Janeece Riggers, MD       Or   MEDLINE mouth rinse  15 mL Mouth Rinse Once Janeece Riggers, MD       enoxaparin (LOVENOX) injection 40 mg  40 mg Subcutaneous Once Michael Boston, MD       feeding supplement (ENSURE PRE-SURGERY) liquid 592 mL  592 mL Oral Once Michael Boston, MD       gabapentin (NEURONTIN) capsule 300 mg  300 mg Oral On Call to OR Michael Boston, MD       lactated ringers infusion   Intravenous Continuous Janeece Riggers, MD         Allergies  Allergen Reactions   Codeine Other (See Comments)    Felt jittery    BP (!) 164/81   Pulse 76   Temp 98.2 F (36.8 C) (Oral)   Resp 16   SpO2 98%   Labs: No results found for this or any previous visit (from the past 48 hour(s)).  Imaging / Studies: No results found.   [name], M.D., F.A.C.S. Gastrointestinal and Minimally Invasive Surgery [facility] [address], [address] [place], [place] [address] [phone] Main / Paging  03/15/2020 1:25 PM    [name]

## 2020-03-15 NOTE — Anesthesia Procedure Notes (Signed)
Procedure Name: Intubation Date/Time: 03/15/2020 2:21 PM Performed by: Niel Hummer, CRNA Pre-anesthesia Checklist: Patient identified, Emergency Drugs available, Suction available and Patient being monitored Patient Re-evaluated:Patient Re-evaluated prior to induction Oxygen Delivery Method: Circle system utilized Preoxygenation: Pre-oxygenation with 100% oxygen Induction Type: IV induction and Rapid sequence Laryngoscope Size: Mac and 4 Grade View: Grade I Tube type: Oral Tube size: 7.5 mm Number of attempts: 1 Airway Equipment and Method: Stylet Placement Confirmation: ETT inserted through vocal cords under direct vision,  positive ETCO2 and breath sounds checked- equal and bilateral Secured at: 22 cm Tube secured with: Tape Dental Injury: Teeth and Oropharynx as per pre-operative assessment

## 2020-03-15 NOTE — Op Note (Signed)
03/15/2020  4:00 PM  PATIENT:  Albert Mayer  64 y.o. male  Patient Care Team: Christain Sacramento, MD as PCP - General (Family Medicine) Armbruster, Carlota Raspberry, MD as Consulting Physician (Gastroenterology) Michael Boston, MD as Consulting Physician (General Surgery)  PRE-OPERATIVE DIAGNOSIS:  APPENDICITIS WITH ABSCESS , HISTORY OF COLON POLYPS  POST-OPERATIVE DIAGNOSIS:  APPENDICITIS WITH ABSCESS ,HISTORY OF COLON POLYPS  PROCEDURE:  Procedure(s): APPENDECTOMY LAPAROSCOPIC  SURGEON:  [name], MD  ASSISTANT: Ahmed Prima, MD [facility]  ANESTHESIA:   General  Nerve block provided with liposomal bupivacaine (Experel) mixed with 0.25% bupivacaine as a Bilateral TAP block x 7mL each side at the level of the transverse abdominis & preperitoneal spaces along the flank at the anterior axillary line, from subcostal ridge to iliac crest under laparoscopic guidance   Local field block at port sites & extraction wound  EBL:  Total I/O In: 1350 [I.V.:1000; IV Piggyback:350] Out: -   Delay start of Pharmacological VTE agent (>24hrs) due to surgical blood loss or risk of bleeding:  no  DRAINS: none   SPECIMEN:  Appendix  DISPOSITION OF SPECIMEN:  PATHOLOGY  COUNTS:  YES  PLAN OF CARE: Home  PATIENT DISPOSITION:  PACU - hemodynamically stable.  INDICATION:    Mr. Gruenhagen had perforated appendicitis in August, requiring drain placement. S/p drain removal and now presenting for interval appendectomy.   I recommended segmental resection:  The anatomy & physiology of the digestive tract was discussed.  The pathophysiology was discussed.  Natural history risks without surgery was discussed.   I worked to give an overview of the disease and the frequent need to have multispecialty involvement.  I feel the risks of no intervention will lead to serious problems that outweigh the operative risks; therefore, I recommended an appendectomy to remove the pathology.  Laparoscopic & open techniques  were discussed.    Risks such as bleeding, infection, abscess, leak, reoperation, possible ostomy, hernia, heart attack, death, and other risks were discussed.  I noted a good likelihood this will help address the problem.   Goals of post-operative recovery were discussed as well.  We will work to minimize complications.  An educational handout on the pathology was given as well.  Questions were answered.    The patient expresses understanding & wishes to proceed with surgery.  OR FINDINGS: Patient had area of inflammation and perforated appendix from prior appendicitis.    DESCRIPTION:  The patient was identified & brought into the operating room. The patient was positioned supine with arms tucked. SCDs were active during the entire case. The patient underwent general anesthesia without any difficulty.  The abdomen was prepped and draped in a sterile fashion. A Surgical Timeout confirmed our plan.  I made a transverse incision through the superior umbilical fold.  I made a small transverse nick through the infraumbilical fascia and confirmed peritoneal entry.  I placed a 20mm port.  We induced carbon dioxide insufflation.  Camera inspection revealed no injury.  I placed additional ports under direct laparoscopic visualization.  I mobilized the terminal ileum to proximal ascending colon in a lateral to medial fashion.  I took care to avoid injuring any retroperitoneal structures. I freed the appendix off its attachments to the ascending colon and cecal mesentery.  I elevated the appendix. I skeletonized the mesoappendix. I was able to free off the entire appendix to the base of the appendix which was still viable.  I stapled the appendix off the cecum using a  laparoscopic stapler. I took a healthy cuff of viable cecum. I placed the appendix inside an EcoSac bag and removed out the 68mm stapler port.  I did copious irrigation. Hemostasis was good in the mesoappendix, colon mesentery, and  retroperitoneum. Staple line was intact on the cecum with bleeding. Therefore, I elected to place a figure of eight 0-vicryl around the bleeding area of the staple line. This achieved hemostasis. I washed out the pelvis, retrohepatic space and right paracolic gutter.  Hemostasis is good. There was no additional perforation or injury. Because the area cleaned up well after irrigation, I did not place a drain.  I closed the 12 mm stapler port site with a 0 Vicryl stitch using a suture passer under direct laparoscopic visualization.   I aspirated the carbon dioxide. I removed the ports.  I closed skin using 4-0 monocryl stitch.  Sterile dressings applied.  Patient was extubated and sent to the recovery room.  I discussed the operative findings with the patient's family. I suspect the patient is going home. Questions answered. They expressed understanding and appreciation.  Ahmed Prima, MD [facility] PGY 5  General Surgery  Attending: [name], M.D., F.A.C.S. Gastrointestinal and Minimally Invasive Surgery [facility] [address] [place], [place] [address] [phone] Main / Paging

## 2020-03-15 NOTE — H&P (Signed)
Albert Mayer DOB: 1956-03-25 Single / Language: Albert Mayer / Race: Refused to Report/Unreported Male  History of Present Illness Albert Hector MD; 02/05/2020 4:58 PM) The patient is a 64 year old male who presents with appendicitis. Note for "Appendicitis": ` ` ` Patient sent for surgical consultation at the request of Dr Albert Mayer  Chief Complaint: Appendicitis with abscess. ` ` The patient is a pleasant obese male with severe pain 10 days prior to admission with intra-abdominal abscess retrocecal I suspicious for perforated appendicitis. He underwent percutaneous drainage. He stabilized eventually recovered and went home a week later. His transition to oral antibiotics. He's had a follow-up drain study which showed no abscess cavity nor fistula. Drainage output was somewhat purulence of a held off and removal.  Patient comes any by himself. He notes the drainage output has tapered off in its become more clear. He finished his Augmentin antibiotics. He is moving his bowels every day. He is eating well. Appetite is good. He does have a history of colon polyps. He had numerous polyps removed in 2017 by Dr. Havery Mayer with [facility] gastroenterology. Numerous to is adenomatous. 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 I cannot determine what exactly the follow-up was but I suspected was 3 years, 5 at the latest. Patient recalls being told he is overdue.  (Review of systems as stated in this history (HPI) or in the review of systems. Otherwise all other 12 point ROS are negative) ` ` ###########################################`  This patient encounter took 35 minutes today to perform the following: obtain history, perform exam, review outside records, interpret tests & imaging, counsel the patient on their diagnosis; and, document this encounter, including findings & plan in the electronic health record  (EHR).   Allergies Albert Mayer, [place]; 02/05/2020 3:01 PM) Codeine Phosphate *ANALGESICS - OPIOID* patient stated that codeine gives him the gitters Allergies Reconciled  Medication History Albert Mayer, RMA; 02/05/2020 3:03 PM) Pantoprazole Sodium (40MG  Tablet DR, Oral) Active. Methocarbamol (500MG  Tablet, Oral) Active. Atorvastatin Calcium (40MG  Tablet, Oral) Active. metFORMIN HCl ER (500MG  Tablet ER 24HR, Oral) Active. amLODIPine Besylate (5MG  Tablet, Oral) Active. glipiZIDE ER (10MG  Tablet ER 24HR, Oral) Active. Lisinopril-hydroCHLOROthiazide (20-12.5MG  Tablet, Oral) Active. Saccharomyces boulardii (250MG  Capsule, Oral) Active. Medications Reconciled    Vitals Albert Mayer RMA; 02/05/2020 3:04 PM) 02/05/2020 3:04 PM Weight: 268.5 lb Height: 72in Body Surface Area: 2.41 m Body Mass Index: 36.41 kg/m  Temp.: 98.22F  Pulse: 83 (Regular)  P.OX: 95% (Room air) BP: 120/80(Sitting, Left Arm, Standard)        Physical Exam Albert Hector MD; 02/05/2020 3:19 PM)  General Mental Status-Alert. General Appearance-Not in acute distress, Not Sickly. Orientation-Oriented X3. Hydration-Well hydrated. Voice-Normal.  Integumentary Global Assessment Upon inspection and palpation of skin surfaces of the - Axillae: non-tender, no inflammation or ulceration, no drainage. and Distribution of scalp and body hair is normal. General Characteristics Temperature - normal warmth is noted.  Head and Neck Head-normocephalic, atraumatic with no lesions or palpable masses. Face Global Assessment - atraumatic, no absence of expression. Neck Global Assessment - no abnormal movements, no bruit auscultated on the right, no bruit auscultated on the left, no decreased range of motion, non-tender. Trachea-midline. Thyroid Gland Characteristics - non-tender.  Eye Eyeball - Left-Extraocular movements intact, No Nystagmus -  Left. Eyeball - Right-Extraocular movements intact, No Nystagmus - Right. Cornea - Left-No Hazy - Left. Cornea - Right-No Hazy - Right. Sclera/Conjunctiva - Left-No scleral icterus, No Discharge -  Left. Sclera/Conjunctiva - Right-No scleral icterus, No Discharge - Right. Pupil - Left-Direct reaction to light normal. Pupil - Right-Direct reaction to light normal.  ENMT Ears Pinna - Left - no drainage observed, no generalized tenderness observed. Pinna - Right - no drainage observed, no generalized tenderness observed. Nose and Sinuses External Inspection of the Nose - no destructive lesion observed. Inspection of the nares - Left - quiet respiration. Inspection of the nares - Right - quiet respiration. Mouth and Throat Lips - Upper Lip - no fissures observed, no pallor noted. Lower Lip - no fissures observed, no pallor noted. Nasopharynx - no discharge present. Oral Cavity/Oropharynx - Tongue - no dryness observed. Oral Mucosa - no cyanosis observed. Hypopharynx - no evidence of airway distress observed.  Chest and Lung Exam Inspection Movements - Normal and Symmetrical. Accessory muscles - No use of accessory muscles in breathing. Palpation Palpation of the chest reveals - Non-tender. Auscultation Breath sounds - Normal and Clear.  Cardiovascular Auscultation Rhythm - Regular. Murmurs & Other Heart Sounds - Auscultation of the heart reveals - No Murmurs and No Systolic Clicks.  Abdomen Inspection Inspection of the abdomen reveals - No Visible peristalsis and No Abnormal pulsations. Umbilicus - No Bleeding, No Urine drainage. Palpation/Percussion Palpation and Percussion of the abdomen reveal - Soft, Non Tender, No Rebound tenderness, No Rigidity (guarding) and No Cutaneous hyperesthesia. Note: Abdomen obese but soft. Moderate diastases recti. Nontender. Not distended. No umbilical or incisional hernias. No guarding.  Drainage of right flank with  serosanguineous output. Output less than 10 mL a day for the past week. I removed the drain without incident.  Male Genitourinary Sexual Maturity Tanner 5 - Adult hair pattern and Adult penile size and shape.  Peripheral Vascular Upper Extremity Inspection - Left - No Cyanotic nailbeds - Left, Not Ischemic. Inspection - Right - No Cyanotic nailbeds - Right, Not Ischemic.  Neurologic Neurologic evaluation reveals -normal attention span and ability to concentrate, able to name objects and repeat phrases. Appropriate fund of knowledge , normal sensation and normal coordination. Mental Status Affect - not angry, not paranoid. Cranial Nerves-Normal Bilaterally. Gait-Normal.  Neuropsychiatric Mental status exam performed with findings of-able to articulate well with normal speech/language, rate, volume and coherence, thought content normal with ability to perform basic computations and apply abstract reasoning and no evidence of hallucinations, delusions, obsessions or homicidal/suicidal ideation.  Musculoskeletal Global Assessment Spine, Ribs and Pelvis - no instability, subluxation or laxity. Right Upper Extremity - no instability, subluxation or laxity.  Lymphatic Head & Neck  General Head & Neck Lymphatics: Bilateral - Description - No Localized lymphadenopathy. Axillary  General Axillary Region: Bilateral - Description - No Localized lymphadenopathy. Femoral & Inguinal  Generalized Femoral & Inguinal Lymphatics: Left - Description - No Localized lymphadenopathy. Right - Description - No Localized lymphadenopathy.    Assessment & Plan Albert Hector MD; 02/05/2020 3:23 PM)  ACUTE APPENDICITIS WITH PERFORATION AND PERITONEAL ABSCESS (K35.33) Impression: Pleasant gentleman with periappendiceal abscess status post percutaneous drainage and antibiotics. Consistent with appendicitis.  Follow-up drain study shows no more abscess and drainage since been  removed.  I think he would benefit from interval appendectomy 6-8 weeks from diagnosis. I would place it around mid-October. This allows inflammation to be minimal to have better success of a laparoscopic appendectomy. Hopefully an outpatient setting. Minimize risk of recurrence which is not insignificant.  I think it would be wise for him to get a colonoscopy done before surgery since he is overdue and his  history numerous polyps. Make sure there are no other surprises. See if this can be coordinated have colonoscopy the day before surgery and an appendectomy, limiting the need for multiple bowel problems.  Current Plans The anatomy & physiology of the digestive tract was discussed. The pathophysiology of appendicitis and other appendiceal disorders were discussed. Natural history risks without surgery was discussed. I feel the risks of no intervention will lead to serious problems that outweigh the operative risks; therefore, I recommended diagnostic laparoscopy with removal of appendix to remove the pathology. Laparoscopic & open techniques were discussed. I noted a good likelihood this will help address the problem. Risks such as bleeding, infection, abscess, leak, reoperation, possible ostomy, hernia, heart attack, death, and other risks were discussed. Goals of post-operative recovery were discussed as well. We will work to minimize complications. Questions were answered. The patient expresses understanding & wishes to proceed with surgery.   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 Numerous and 1 with high-grade dysplasia in 2017. He is pretty certain he was overdue for follow-up.  It would be wise for him to have a colonoscopy done prior to surgery to make sure there are no surprises.  Current Plans Pt Education - Polyps in the Colon and Rectum (Colonic and Rectal Polyps): colonic polyps  PREOP COLON - ENCOUNTER FOR  PREOPERATIVE EXAMINATION FOR GENERAL SURGICAL PROCEDURE (Z01.818)  Current Plans You are being scheduled for surgery- Our schedulers will call you.  You should hear from our office's scheduling department within 5 working days about the location, date, and time of surgery. We try to make accommodations for patient's preferences in scheduling surgery, but sometimes the OR schedule or the surgeon's schedule prevents [place] from making those accommodations.  If you have not heard from our office [phone]) in 5 working days, call the office and ask for your surgeon's nurse.  If you have other questions about your diagnosis, plan, or surgery, call the office and ask for your surgeon's nurse.  Written instructions provided Pt Education - [facility] Colon Bowel Prep 2018 ERAS/Miralax/Antibiotics Started Neomycin Sulfate 500 MG Oral Tablet, 2 (two) Tablet SEE NOTE, #6, 02/05/2020, No Refill. Local Order: Pharmacist Notes: TAKE TWO TABLETS AT 2 PM, 3 PM, AND 10 PM THE DAY PRIOR TO SURGERY Started Flagyl 500 MG Oral Tablet, 2 (two) Tablet SEE NOTE, #6, 02/05/2020, No Refill. Local Order: Pharmacist Notes: Take at 2pm, 3pm, and 10pm the day prior to your colon operation Pt Education - Pamphlet Given - Laparoscopic Colorectal Surgery: discussed with patient and provided information. Pt Education - [facility] Colectomy post-op instructions: discussed with patient and provided information.  DIASTASIS RECTI (M62.08)  Current Plans Pt Education - [facility] Diastasis Recti: discussed with patient and provided information.  Albert Hector, MD, FACS, MASCRS Gastrointestinal and Minimally Invasive Surgery  [facility] [address] [place], [place] [address] [phone] Fax [phone] Main/Paging  CONTACT INFORMATION: Weekday (9AM-5PM) concerns: Call [facility] main office at [phone] Weeknight (5PM-9AM) or Weekend/Holiday concerns: Check [email or web address] for General Surgery  [facility] coverage (Please, do not use SecureChat as it is not reliable communication to operating surgeons for immediate patient care)             Colonoscopy done w/o cecal/appendiceal concerns Ready for interval appendectomy, poss ileocectomy  Albert Hector, MD, FACS, MASCRS Gastrointestinal and Minimally Invasive Surgery  [facility] [address] [place], [place] [address] [phone] Fax [phone]  Main/Paging  CONTACT INFORMATION: Weekday (9AM-5PM) concerns: Call [facility] main office at [phone] Weeknight (5PM-9AM) or Weekend/Holiday concerns: Check [email or web address] for General Surgery [facility] coverage (Please, do not use SecureChat as it is not reliable communication to operating surgeons for immediate patient care)

## 2020-03-15 NOTE — Discharge Instructions (Signed)
SURGERY: POST OP INSTRUCTIONS °(Surgery for small bowel obstruction, colon resection, etc) ° ° °###################################################################### ° °EAT °Gradually transition to a high fiber diet with a fiber supplement over the next few days after discharge ° °WALK °Walk an hour a day.  Control your pain to do that.   ° °CONTROL PAIN °Control pain so that you can walk, sleep, tolerate sneezing/coughing, go up/down stairs. ° °HAVE A BOWEL MOVEMENT DAILY °Keep your bowels regular to avoid problems.  OK to try a laxative to override constipation.  OK to use an antidairrheal to slow down diarrhea.  Call if not better after 2 tries ° °CALL IF YOU HAVE PROBLEMS/CONCERNS °Call if you are still struggling despite following these instructions. °Call if you have concerns not answered by these instructions ° °###################################################################### ° ° °DIET °Follow a light diet the first few days at home.  Start with a bland diet such as soups, liquids, starchy foods, low fat foods, etc.  If you feel full, bloated, or constipated, stay on a ful liquid or pureed/blenderized diet for a few days until you feel better and no longer constipated. °Be sure to drink plenty of fluids every day to avoid getting dehydrated (feeling dizzy, not urinating, etc.). °Gradually add a fiber supplement to your diet over the next week.  Gradually get back to a regular solid diet.  Avoid fast food or heavy meals the first week as you are more likely to get nauseated. °It is expected for your digestive tract to need a few months to get back to normal.  It is common for your bowel movements and stools to be irregular.  You will have occasional bloating and cramping that should eventually fade away.  Until you are eating solid food normally, off all pain medications, and back to regular activities; your bowels will not be normal. °Focus on eating a low-fat, high fiber diet the rest of your life  (See Getting to Good Bowel Health, below). ° °CARE of your INCISION or WOUND °It is good for closed incision and even open wounds to be washed every day.  Shower every day.  Short baths are fine.  Wash the incisions and wounds clean with soap & water.    °If you have a closed incision(s), wash the incision with soap & water every day.  You may leave closed incisions open to air if it is dry.   You may cover the incision with clean gauze & replace it after your daily shower for comfort. °If you have skin tapes (Steristrips) or skin glue (Dermabond) on your incision, leave them in place.  They will fall off on their own like a scab.  You may trim any edges that curl up with clean scissors.  If you have staples, set up an appointment for them to be removed in the office in 10 days after surgery.  °If you have a drain, wash around the skin exit site with soap & water and place a new dressing of gauze or band aid around the skin every day.  Keep the drain site clean & dry.    °If you have an open wound with packing, see wound care instructions.  In general, it is encouraged that you remove your dressing and packing, shower with soap & water, and replace your dressing once a day.  Pack the wound with clean gauze moistened with normal (0.9%) saline to keep the wound moist & uninfected.  Pressure on the dressing for 30 minutes will stop most wound   bleeding.  Eventually your body will heal & pull the open wound closed over the next few months.  °Raw open wounds will occasionally bleed or secrete yellow drainage until it heals closed.  Drain sites will drain a little until the drain is removed.  Even closed incisions can have mild bleeding or drainage the first few days until the skin edges scab over & seal.   °If you have an open wound with a wound vac, see wound vac care instructions. ° ° ° ° °ACTIVITIES as tolerated °Start light daily activities --- self-care, walking, climbing stairs-- beginning the day after surgery.   Gradually increase activities as tolerated.  Control your pain to be active.  Stop when you are tired.  Ideally, walk several times a day, eventually an hour a day.   °Most people are back to most day-to-day activities in a few weeks.  It takes 4-8 weeks to get back to unrestricted, intense activity. °If you can walk 30 minutes without difficulty, it is safe to try more intense activity such as jogging, treadmill, bicycling, low-impact aerobics, swimming, etc. °Save the most intensive and strenuous activity for last (Usually 4-8 weeks after surgery) such as sit-ups, heavy lifting, contact sports, etc.  Refrain from any intense heavy lifting or straining until you are off narcotics for pain control.  You will have off days, but things should improve week-by-week. °DO NOT PUSH THROUGH PAIN.  Let pain be your guide: If it hurts to do something, don't do it.  Pain is your body warning you to avoid that activity for another week until the pain goes down. °You may drive when you are no longer taking narcotic prescription pain medication, you can comfortably wear a seatbelt, and you can safely make sudden turns/stops to protect yourself without hesitating due to pain. °You may have sexual intercourse when it is comfortable. If it hurts to do something, stop. ° °MEDICATIONS °Take your usually prescribed home medications unless otherwise directed.   °Blood thinners:  °Usually you can restart any strong blood thinners after the second postoperative day.  It is OK to take aspirin right away.    ° If you are on strong blood thinners (warfarin/Coumadin, Plavix, Xerelto, Eliquis, Pradaxa, etc), discuss with your surgeon, medicine PCP, and/or cardiologist for instructions on when to restart the blood thinner & if blood monitoring is needed (PT/INR blood check, etc).   ° ° °PAIN CONTROL °Pain after surgery or related to activity is often due to strain/injury to muscle, tendon, nerves and/or incisions.  This pain is usually  short-term and will improve in a few months.  °To help speed the process of healing and to get back to regular activity more quickly, DO THE FOLLOWING THINGS TOGETHER: °1. Increase activity gradually.  DO NOT PUSH THROUGH PAIN °2. Use Ice and/or Heat °3. Try Gentle Massage and/or Stretching °4. Take over the counter pain medication °5. Take Narcotic prescription pain medication for more severe pain ° °Good pain control = faster recovery.  It is better to take more medicine to be more active than to stay in bed all day to avoid medications. °1.  Increase activity gradually °Avoid heavy lifting at first, then increase to lifting as tolerated over the next 6 weeks. °Do not “push through” the pain.  Listen to your body and avoid positions and maneuvers than reproduce the pain.  Wait a few days before trying something more intense °Walking an hour a day is encouraged to help your body recover faster   and more safely.  Start slowly and stop when getting sore.  If you can walk 30 minutes without stopping or pain, you can try more intense activity (running, jogging, aerobics, cycling, swimming, treadmill, sex, sports, weightlifting, etc.) °Remember: If it hurts to do it, then don’t do it! °2. Use Ice and/or Heat °You will have swelling and bruising around the incisions.  This will take several weeks to resolve. °Ice packs or heating pads (6-8 times a day, 30-60 minutes at a time) will help sooth soreness & bruising. °Some people prefer to use ice alone, heat alone, or alternate between ice & heat.  Experiment and see what works best for you.  Consider trying ice for the first few days to help decrease swelling and bruising; then, switch to heat to help relax sore spots and speed recovery. °Shower every day.  Short baths are fine.  It feels good!  Keep the incisions and wounds clean with soap & water.   °3. Try Gentle Massage and/or Stretching °Massage at the area of pain many times a day °Stop if you feel pain - do not  overdo it °4. Take over the counter pain medication °This helps the muscle and nerve tissues become less irritable and calm down faster °Choose ONE of the following over-the-counter anti-inflammatory medications: °Acetaminophen 500mg tabs (Tylenol) 1-2 pills with every meal and just before bedtime (avoid if you have liver problems or if you have acetaminophen in you narcotic prescription) °Naproxen 220mg tabs (ex. Aleve, Naprosyn) 1-2 pills twice a day (avoid if you have kidney, stomach, IBD, or bleeding problems) °Ibuprofen 200mg tabs (ex. Advil, Motrin) 3-4 pills with every meal and just before bedtime (avoid if you have kidney, stomach, IBD, or bleeding problems) °Take with food/snack several times a day as directed for at least 2 weeks to help keep pain / soreness down & more manageable. °5. Take Narcotic prescription pain medication for more severe pain °A prescription for strong pain control is often given to you upon discharge (for example: oxycodone/Percocet, hydrocodone/Norco/Vicodin, or tramadol/Ultram) °Take your pain medication as prescribed. °Be mindful that most narcotic prescriptions contain Tylenol (acetaminophen) as well - avoid taking too much Tylenol. °If you are having problems/concerns with the prescription medicine (does not control pain, nausea, vomiting, rash, itching, etc.), please call us [phone] to see if we need to switch you to a different pain medicine that will work better for you and/or control your side effects better. °If you need a refill on your pain medication, you must call the office before 4 pm and on weekdays only.  By federal law, prescriptions for narcotics cannot be called into a pharmacy.  They must be filled out on paper & picked up from our office by the patient or authorized caretaker.  Prescriptions cannot be filled after 4 pm nor on weekends.   ° °WHEN TO CALL US [phone] °Severe uncontrolled or worsening pain  °Fever over 101 F (38.5 C) °Concerns with  the incision: Worsening pain, redness, rash/hives, swelling, bleeding, or drainage °Reactions / problems with new medications (itching, rash, hives, nausea, etc.) °Nausea and/or vomiting °Difficulty urinating °Difficulty breathing °Worsening fatigue, dizziness, lightheadedness, blurred vision °Other concerns °If you are not getting better after two weeks or are noticing you are getting worse, contact our office [phone] for further advice.  We may need to adjust your medications, re-evaluate you in the office, send you to the emergency room, or see what other things we can do to help. °The   clinic staff is available to answer your questions during regular business hours (8:30am-5pm).  Please don’t hesitate to call and ask to speak to one of our nurses for clinical concerns.    °A surgeon from [facility] is always on call at the hospitals 24 hours/day °If you have a medical emergency, go to the nearest emergency room or call 911. ° °FOLLOW UP in our office °One the day of your discharge from the hospital (or the next business weekday), please call [facility] to set up or confirm an appointment to see your surgeon in the office for a follow-up appointment.  Usually it is 2-3 weeks after your surgery.   °If you have skin staples at your incision(s), let the office know so we can set up a time in the office for the nurse to remove them (usually around 10 days after surgery). °Make sure that you call for appointments the day of discharge (or the next business weekday) from the hospital to ensure a convenient appointment time. °IF YOU HAVE DISABILITY OR FAMILY LEAVE FORMS, BRING THEM TO THE OFFICE FOR PROCESSING.  DO NOT GIVE THEM TO YOUR DOCTOR. ° °[facility] °[address], [address], [place], [place]  [address] ? °[phone] - Main °[phone] - Toll Free,  [phone] - Fax °[email or web address] ° °GETTING TO GOOD BOWEL HEALTH. °It is  expected for your digestive tract to need a few months to get back to normal.  It is common for your bowel movements and stools to be irregular.  You will have occasional bloating and cramping that should eventually fade away.  Until you are eating solid food normally, off all pain medications, and back to regular activities; your bowels will not be normal.   °Avoiding constipation °The goal: ONE SOFT BOWEL MOVEMENT A DAY!    °Drink plenty of fluids.  Choose water first. °TAKE A FIBER SUPPLEMENT EVERY DAY THE REST OF YOUR LIFE °During your first week back home, gradually add back a fiber supplement every day °Experiment which form you can tolerate.   There are many forms such as powders, tablets, wafers, gummies, etc °Psyllium bran (Metamucil), methylcellulose (Citrucel), Miralax or Glycolax, Benefiber, Flax Seed.  °Adjust the dose week-by-week (1/2 dose/day to 6 doses a day) until you are moving your bowels 1-2 times a day.  Cut back the dose or try a different fiber product if it is giving you problems such as diarrhea or bloating. °Sometimes a laxative is needed to help jump-start bowels if constipated until the fiber supplement can help regulate your bowels.  If you are tolerating eating & you are farting, it is okay to try a gentle laxative such as double dose MiraLax, prune juice, or Milk of Magnesia.  Avoid using laxatives too often. °Stool softeners can sometimes help counteract the constipating effects of narcotic pain medicines.  It can also cause diarrhea, so avoid using for too long. °If you are still constipated despite taking fiber daily, eating solids, and a few doses of laxatives, call our office. °Controlling diarrhea °Try drinking liquids and eating bland foods for a few days to avoid stressing your intestines further. °Avoid dairy products (especially milk & ice cream) for a short time.  The intestines often can lose the ability to digest lactose when stressed. °Avoid foods that cause gassiness or  bloating.  Typical foods include beans and other legumes, cabbage, broccoli, and dairy foods.  Avoid greasy, spicy, fast foods.  Every person has   some sensitivity to other foods, so listen to your body and avoid those foods that trigger problems for you. °Probiotics (such as active yogurt, Align, etc) may help repopulate the intestines and colon with normal bacteria and calm down a sensitive digestive tract °Adding a fiber supplement gradually can help thicken stools by absorbing excess fluid and retrain the intestines to act more normally.  Slowly increase the dose over a few weeks.  Too much fiber too soon can backfire and cause cramping & bloating. °It is okay to try and slow down diarrhea with a few doses of antidiarrheal medicines.   °Bismuth subsalicylate (ex. Kayopectate, Pepto Bismol) for a few doses can help control diarrhea.  Avoid if pregnant.   °Loperamide (Imodium) can slow down diarrhea.  Start with one tablet (2mg) first.  Avoid if you are having fevers or severe pain.  °ILEOSTOMY PATIENTS WILL HAVE CHRONIC DIARRHEA since their colon is not in use.    °Drink plenty of liquids.  You will need to drink even more glasses of water/liquid a day to avoid getting dehydrated. °Record output from your ileostomy.  Expect to empty the bag every 3-4 hours at first.  Most people with a permanent ileostomy empty their bag 4-6 times at the least.   °Use antidiarrheal medicine (especially Imodium) several times a day to avoid getting dehydrated.  Start with a dose at bedtime & breakfast.  Adjust up or down as needed.  Increase antidiarrheal medications as directed to avoid emptying the bag more than 8 times a day (every 3 hours). °Work with your wound ostomy nurse to learn care for your ostomy.  See ostomy care instructions. °TROUBLESHOOTING IRREGULAR BOWELS °1) Start with a soft & bland diet. No spicy, greasy, or fried foods.  °2) Avoid gluten/wheat or dairy products from diet to see if symptoms improve. °3) Miralax  17gm or flax seed mixed in 8oz. water or juice-daily. May use 2-4 times a day as needed. °4) Gas-X, Phazyme, etc. as needed for gas & bloating.  °5) Prilosec (omeprazole) over-the-counter as needed °6)  Consider probiotics (Align, Activa, etc) to help calm the bowels down ° °Call your doctor if you are getting worse or not getting better.  Sometimes further testing (cultures, endoscopy, X-ray studies, CT scans, bloodwork, etc.) may be needed to help diagnose and treat the cause of the diarrhea. °[facility] °[address], [address], [place], [place]  [address] °[phone] - Main.    °[phone]  - Toll Free.   [phone] - Fax °[email or web address] ° ° °

## 2020-03-15 NOTE — Anesthesia Preprocedure Evaluation (Addendum)
Anesthesia Evaluation  Patient identified by MRN, date of birth, ID band Patient awake    Reviewed: Allergy & Precautions, NPO status , Patient's Chart, lab work & pertinent test results  Airway Mallampati: III  TM Distance: >3 FB Neck ROM: Full    Dental  (+) Missing, Chipped   Pulmonary neg pulmonary ROS,    Pulmonary exam normal breath sounds clear to auscultation       Cardiovascular hypertension, Normal cardiovascular exam Rhythm:Regular Rate:Normal     Neuro/Psych negative neurological ROS     GI/Hepatic negative GI ROS, Neg liver ROS,   Endo/Other  diabetes  Renal/GU Renal disease     Musculoskeletal  (+) Arthritis ,   Abdominal (+) + obese,   Peds  Hematology negative hematology ROS (+)   Anesthesia Other Findings   Reproductive/Obstetrics                            Anesthesia Physical Anesthesia Plan  ASA: II  Anesthesia Plan: General   Post-op Pain Management:    Induction: Intravenous and Rapid sequence  PONV Risk Score and Plan: 4 or greater and Ondansetron, Dexamethasone and Treatment may vary due to age or medical condition  Airway Management Planned: Oral ETT  Additional Equipment: None  Intra-op Plan:   Post-operative Plan: Extubation in OR  Informed Consent: I have reviewed the patients History and Physical, chart, labs and discussed the procedure including the risks, benefits and alternatives for the proposed anesthesia with the patient or authorized representative who has indicated his/her understanding and acceptance.     Dental advisory given  Plan Discussed with: CRNA  Anesthesia Plan Comments:        Anesthesia Quick Evaluation

## 2020-03-18 ENCOUNTER — Telehealth: Payer: Self-pay

## 2020-03-18 ENCOUNTER — Telehealth: Payer: Self-pay | Admitting: *Deleted

## 2020-03-18 ENCOUNTER — Encounter (HOSPITAL_COMMUNITY): Payer: Self-pay | Admitting: Surgery

## 2020-03-18 LAB — SURGICAL PATHOLOGY

## 2020-03-18 NOTE — Telephone Encounter (Signed)
°  Follow up Call-  Call back number 03/14/2020 12/07/2019  Post procedure Call Back phone  # [phone]  Permission to leave phone message Yes -  Some recent data might be hidden     Patient questions:  Message left to call if necessary.

## 2020-03-18 NOTE — Telephone Encounter (Signed)
  Follow up Call-  Call back number 03/14/2020 12/07/2019  Post procedure Call Back phone  # [phone]  Permission to leave phone message Yes -  Some recent data might be hidden     Patient questions:  Do you have a fever, pain , or abdominal swelling? No. Pain Score  0 *  Have you tolerated food without any problems? Yes.    Have you been able to return to your normal activities? Yes.    Do you have any questions about your discharge instructions: Diet   No. Medications  No. Follow up visit  No.  Do you have questions or concerns about your Care? No.  Actions: * If pain score is 4 or above: No action needed, pain <4.  1. Have you developed a fever since your procedure? No  2.   Have you had an respiratory symptoms (SOB or cough) since your procedure? No 3.   Have you tested positive for COVID 19 since your procedure No  4.   Have you had any family members/close contacts diagnosed with the COVID 19 since your procedure?  No   If yes to any of these questions please route to Joylene John, RN and Joella Prince, RN

## 2020-03-20 ENCOUNTER — Encounter: Payer: Self-pay | Admitting: Gastroenterology

## 2020-03-20 NOTE — Anesthesia Postprocedure Evaluation (Signed)
Anesthesia Post Note  Patient: Albert Mayer  Procedure(s) Performed: APPENDECTOMY LAPAROSCOPIC (N/A )     Patient location during evaluation: PACU Anesthesia Type: General Level of consciousness: awake Pain management: pain level controlled Vital Signs Assessment: post-procedure vital signs reviewed and stable Respiratory status: spontaneous breathing Cardiovascular status: stable Postop Assessment: no apparent nausea or vomiting Anesthetic complications: no   No complications documented.  Last Vitals:  Vitals:   03/15/20 1632 03/15/20 1705  BP: (!) 165/68 (!) 154/80  Pulse: 71 68  Resp: 12 18  Temp:  36.7 C  SpO2: 98% 95%    Last Pain:  Vitals:   03/19/20 0952  TempSrc:   PainSc: 5                  [name]

## 2020-12-07 IMAGING — CT CT L SPINE W/ CM
1 of 6 series · 6 of 14 positions shown, 8 images · non-contrast
Comparison: None

CLINICAL DATA: Right low back pain radiating down the posterior
aspect of the right leg.
TECHNIQUE: Contiguous axial images were obtained through the Lumbar spine after
the intrathecal infusion of infusion. Coronal and sagittal
reconstructions were obtained of the axial image sets.

[Series 2: l spine soft · axial · 0.29mm/px · z∈[-330,-128]mm · 6 of 95 slices shown, 8 images]
[im 14/95  soft-tissue]
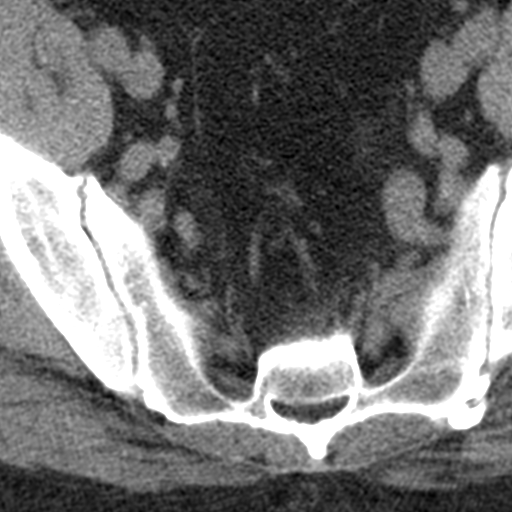
[im 14/95  bone]
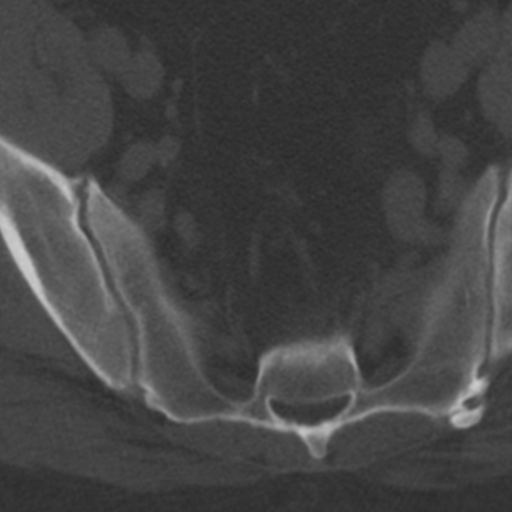
[im 27/95  bone]
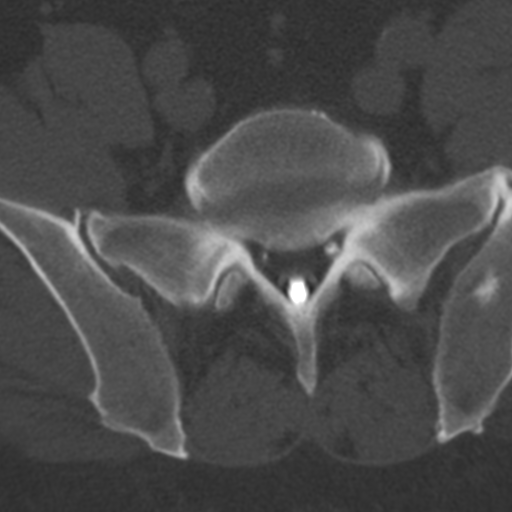
[im 41/95  bone]
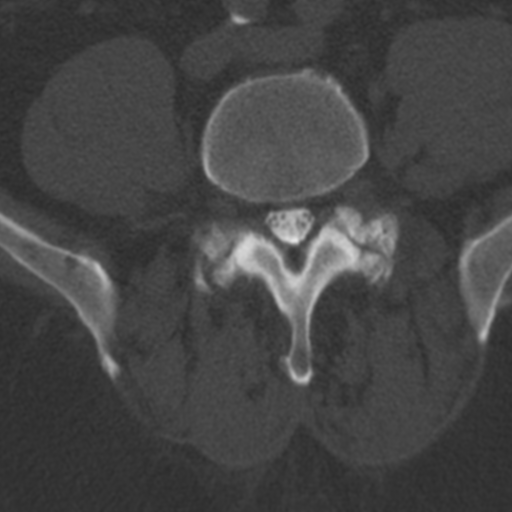
[im 54/95  bone]
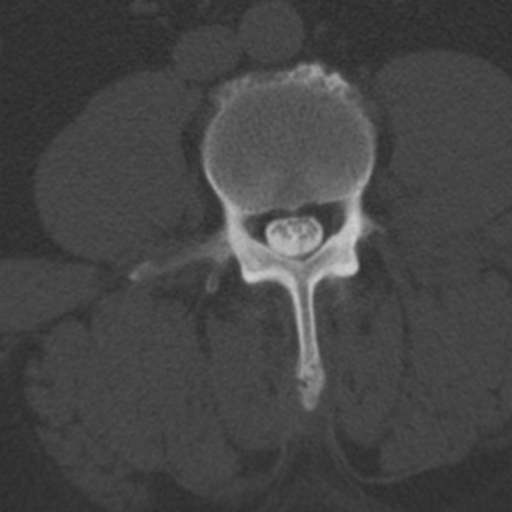
[im 68/95  soft-tissue]
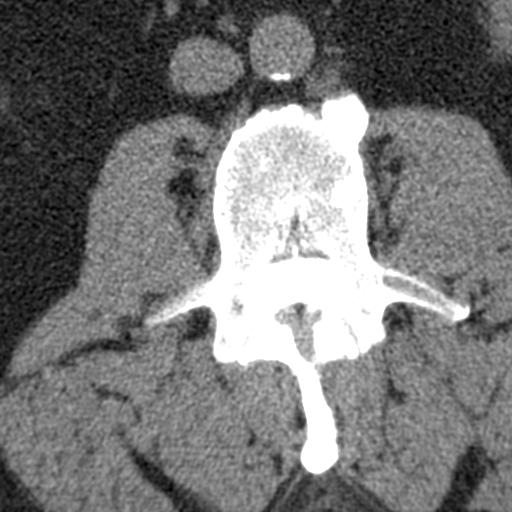
[im 68/95  bone]
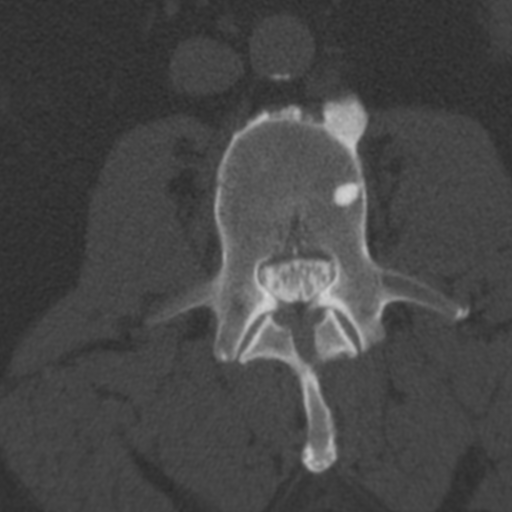
[im 81/95  bone]
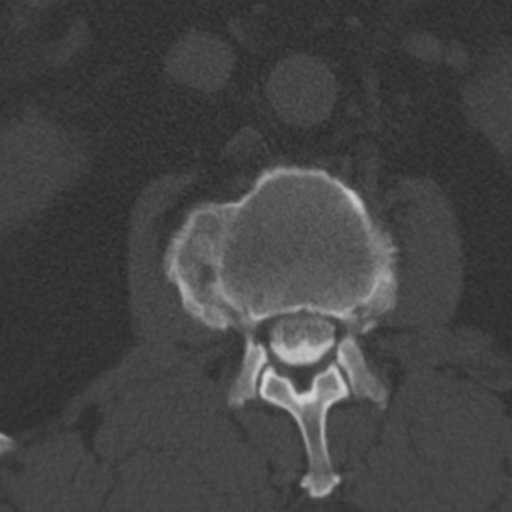

[6 of 14 positions shown; findings below may reference images not displayed]

EXAM:
LUMBAR MYELOGRAM

FLUOROSCOPY TIME:  Fluoroscopy Time: 41 seconds

Radiation Exposure Index: 325.23 microGray*m^2

PROCEDURE:
After thorough discussion of risks and benefits of the procedure
including bleeding, infection, injury to nerves, blood vessels,
adjacent structures as well as headache and CSF leak, written and
oral informed consent was obtained. Consent was obtained by Dr.
Luigi Hinkle. Time out form was completed.

Patient was positioned prone on the fluoroscopy table. Local
anesthesia was provided with 1% lidocaine without epinephrine after
prepped and draped in the usual sterile fashion. Puncture was
performed at L2-3 using a 3 1/2 inch 22-gauge spinal needle via a
right interlaminar approach. Using a single pass through the dura,
the needle was placed within the thecal sac, with return of clear
CSF. 15 mL of Isovue S-BQQ was injected into the thecal sac, with
normal opacification of the nerve roots and cauda equina consistent
with free flow within the subarachnoid space.

I personally performed the lumbar puncture and administered the
intrathecal contrast. I also personally supervised acquisition of
the myelogram images.
FINDINGS: LUMBAR MYELOGRAM FINDINGS:

The lowest fully formed intervertebral disc space is designated
L5-S1. With this numbering, there are no ribs at T12.

Vertebral alignment is normal, and no abnormal motion is evident on
upright flexion or extension radiographs. A ventral extradural
defect at L3-4 increases with standing resulting in moderate spinal
stenosis. There is also evidence of asymmetric right lateral recess
stenosis at L3-4 which improves with leftward bending. A ventral
extradural defect at L4-5 also increases with standing and results
in mild spinal stenosis, and there is evidence of bilateral lateral
recess stenosis at this level. Small ventral extradural defects are
noted at T12-L1 and L1-2 without high-grade stenosis.

CT LUMBAR MYELOGRAM FINDINGS:

Vertebral alignment is normal. No fracture is identified. There is a
subcentimeter densely sclerotic focus in the L2 vertebral body which
may represent a bone island. Moderately large anterior vertebral
osteophytes are present at multiple levels in the mid to upper
lumbar and lower thoracic spine. There is a small region of
heterogeneous sclerosis and lucency/cystic changes in the posterior
left ilium, nonspecific but without frank bone destruction.

The conus medullaris terminates at T12-L1. There is mild abdominal
aortic atherosclerosis without aneurysm.

T12-L1: Circumferential disc bulging with prominent far lateral
osteophytes bilaterally. No significant stenosis.

L1-2: Circumferential disc bulging results in mild spinal stenosis
and mild-to-moderate right and mild left neural foraminal stenosis.

L2-3: Mild disc bulging and mild facet hypertrophy result in mild
right greater than left neural foraminal stenosis without spinal
stenosis.

L3-4: Disc bulging, congenitally short pedicles, and severe left
greater than right facet and ligamentum flavum hypertrophy result in
moderate spinal stenosis, mild right lateral recess stenosis, and
mild-to-moderate right and mild left neural foraminal stenosis.

L4-5: Disc bulging, congenitally short pedicles, and severe facet
hypertrophy result in mild spinal stenosis, moderate right and mild
left lateral recess stenosis, and mild right greater than left
neural foraminal stenosis with potential right L5 nerve root
impingement.

L5-S1: Disc bulging, endplate spurring, and mild facet hypertrophy
result in borderline to mild bilateral neural foraminal stenosis
without spinal stenosis. Disc contacts and slightly displaces the
right S1 nerve root with mild right lateral recess stenosis.
IMPRESSION: 1. Moderate multifactorial spinal stenosis and asymmetric right
lateral recess stenosis at L3-4.
2. Mild spinal and moderate right lateral recess stenosis at L4-5.
3. Mild spinal stenosis at L1-2.
4. Mild right lateral recess stenosis at L5-S1.
5. Aortic Atherosclerosis ([id number]).

## 2022-06-18 ENCOUNTER — Other Ambulatory Visit (HOSPITAL_COMMUNITY): Payer: Self-pay

## 2022-06-18 MED ORDER — TRULICITY 1.5 MG/0.5ML ~~LOC~~ SOAJ
1.5000 mg | SUBCUTANEOUS | 2 refills | Status: DC
  Filled 2022-06-18: qty 2, 28d supply, fill #0
  Filled 2022-08-26: qty 2, 28d supply, fill #1
  Filled 2022-10-26: qty 2, 28d supply, fill #2

## 2022-06-26 ENCOUNTER — Other Ambulatory Visit (HOSPITAL_COMMUNITY): Payer: Self-pay

## 2022-06-26 MED ORDER — ATORVASTATIN CALCIUM 40 MG PO TABS
40.0000 mg | ORAL_TABLET | Freq: Every day | ORAL | 3 refills | Status: DC
  Filled 2022-06-26: qty 90, 90d supply, fill #0
  Filled 2022-09-23: qty 100, 100d supply, fill #0
  Filled 2022-12-28: qty 100, 100d supply, fill #1
  Filled 2023-04-07: qty 100, 100d supply, fill #2

## 2022-06-26 MED ORDER — HYDROCHLOROTHIAZIDE 25 MG PO TABS
25.0000 mg | ORAL_TABLET | Freq: Every day | ORAL | 1 refills | Status: AC
  Filled 2022-06-26 – 2022-09-24 (×3): qty 100, 100d supply, fill #0

## 2022-06-26 MED ORDER — VALSARTAN 320 MG PO TABS
320.0000 mg | ORAL_TABLET | Freq: Every day | ORAL | 3 refills | Status: DC
  Filled 2022-06-26 – 2022-09-23 (×2): qty 100, 100d supply, fill #0
  Filled 2022-12-28: qty 100, 100d supply, fill #1
  Filled 2023-04-07: qty 100, 100d supply, fill #2

## 2022-06-26 MED ORDER — AMLODIPINE BESYLATE 10 MG PO TABS
10.0000 mg | ORAL_TABLET | Freq: Every day | ORAL | 3 refills | Status: DC
  Filled 2022-06-26: qty 90, 90d supply, fill #0
  Filled 2022-09-04 (×2): qty 100, 100d supply, fill #0
  Filled 2023-03-19: qty 100, 100d supply, fill #1

## 2022-06-26 MED ORDER — METFORMIN HCL ER 500 MG PO TB24
1000.0000 mg | ORAL_TABLET | Freq: Two times a day (BID) | ORAL | 3 refills | Status: DC
  Filled 2022-06-26: qty 360, 180d supply, fill #0
  Filled 2022-09-23: qty 400, 100d supply, fill #0
  Filled 2023-01-05: qty 400, 100d supply, fill #1
  Filled 2023-04-12: qty 400, 100d supply, fill #2

## 2022-06-26 MED ORDER — GLIPIZIDE ER 5 MG PO TB24
5.0000 mg | ORAL_TABLET | Freq: Every day | ORAL | 3 refills | Status: AC
  Filled 2022-06-26 – 2022-09-04 (×3): qty 100, 100d supply, fill #0
  Filled 2022-12-28: qty 100, 100d supply, fill #1

## 2022-06-29 ENCOUNTER — Other Ambulatory Visit (HOSPITAL_COMMUNITY): Payer: Self-pay

## 2022-08-27 ENCOUNTER — Other Ambulatory Visit: Payer: Self-pay

## 2022-08-27 ENCOUNTER — Other Ambulatory Visit (HOSPITAL_COMMUNITY): Payer: Self-pay

## 2022-09-04 ENCOUNTER — Other Ambulatory Visit (HOSPITAL_COMMUNITY): Payer: Self-pay

## 2022-09-04 ENCOUNTER — Other Ambulatory Visit: Payer: Self-pay

## 2022-09-09 ENCOUNTER — Other Ambulatory Visit (HOSPITAL_COMMUNITY): Payer: Self-pay

## 2022-09-23 ENCOUNTER — Other Ambulatory Visit: Payer: Self-pay

## 2022-09-23 ENCOUNTER — Other Ambulatory Visit (HOSPITAL_COMMUNITY): Payer: Self-pay

## 2022-09-24 ENCOUNTER — Other Ambulatory Visit (HOSPITAL_COMMUNITY): Payer: Self-pay

## 2022-09-25 ENCOUNTER — Other Ambulatory Visit (HOSPITAL_COMMUNITY): Payer: Self-pay

## 2022-10-02 ENCOUNTER — Other Ambulatory Visit (HOSPITAL_COMMUNITY): Payer: Self-pay

## 2022-10-30 ENCOUNTER — Other Ambulatory Visit (HOSPITAL_COMMUNITY): Payer: Self-pay

## 2022-11-10 ENCOUNTER — Other Ambulatory Visit (HOSPITAL_COMMUNITY): Payer: Self-pay

## 2022-11-10 MED ORDER — HYDROCHLOROTHIAZIDE 50 MG PO TABS
50.0000 mg | ORAL_TABLET | Freq: Every day | ORAL | 1 refills | Status: DC
  Filled 2022-11-10: qty 90, 90d supply, fill #0
  Filled 2023-03-19: qty 90, 90d supply, fill #1

## 2022-11-13 ENCOUNTER — Other Ambulatory Visit (HOSPITAL_COMMUNITY): Payer: Self-pay

## 2022-12-04 ENCOUNTER — Other Ambulatory Visit (HOSPITAL_COMMUNITY): Payer: Self-pay

## 2022-12-04 ENCOUNTER — Other Ambulatory Visit: Payer: Self-pay

## 2022-12-25 ENCOUNTER — Other Ambulatory Visit (HOSPITAL_COMMUNITY): Payer: Self-pay

## 2022-12-26 ENCOUNTER — Other Ambulatory Visit (HOSPITAL_COMMUNITY): Payer: Self-pay

## 2022-12-26 ENCOUNTER — Encounter (HOSPITAL_COMMUNITY): Payer: Self-pay

## 2022-12-27 ENCOUNTER — Other Ambulatory Visit (HOSPITAL_COMMUNITY): Payer: Self-pay

## 2022-12-28 ENCOUNTER — Other Ambulatory Visit (HOSPITAL_COMMUNITY): Payer: Self-pay

## 2022-12-28 MED ORDER — DULAGLUTIDE 1.5 MG/0.5ML ~~LOC~~ SOAJ
1.5000 mg | SUBCUTANEOUS | 2 refills | Status: DC
  Filled 2022-12-28: qty 2, 28d supply, fill #0
  Filled 2023-02-21: qty 2, 28d supply, fill #1
  Filled 2023-04-18: qty 2, 28d supply, fill #2

## 2022-12-29 ENCOUNTER — Other Ambulatory Visit (HOSPITAL_COMMUNITY): Payer: Self-pay

## 2023-01-07 ENCOUNTER — Other Ambulatory Visit (HOSPITAL_COMMUNITY): Payer: Self-pay

## 2023-02-22 ENCOUNTER — Other Ambulatory Visit (HOSPITAL_COMMUNITY): Payer: Self-pay

## 2023-02-23 ENCOUNTER — Other Ambulatory Visit (HOSPITAL_COMMUNITY): Payer: Self-pay

## 2023-03-19 ENCOUNTER — Other Ambulatory Visit: Payer: Self-pay

## 2023-03-19 ENCOUNTER — Other Ambulatory Visit (HOSPITAL_COMMUNITY): Payer: Self-pay

## 2023-04-08 ENCOUNTER — Other Ambulatory Visit (HOSPITAL_COMMUNITY): Payer: Self-pay

## 2023-04-13 ENCOUNTER — Other Ambulatory Visit (HOSPITAL_COMMUNITY): Payer: Self-pay

## 2023-04-19 ENCOUNTER — Other Ambulatory Visit: Payer: Self-pay

## 2023-04-19 ENCOUNTER — Other Ambulatory Visit (HOSPITAL_COMMUNITY): Payer: Self-pay

## 2023-04-20 ENCOUNTER — Other Ambulatory Visit (HOSPITAL_COMMUNITY): Payer: Self-pay

## 2023-05-28 ENCOUNTER — Other Ambulatory Visit (HOSPITAL_COMMUNITY): Payer: Self-pay

## 2023-05-28 MED ORDER — DULAGLUTIDE 1.5 MG/0.5ML ~~LOC~~ SOAJ
1.5000 mg | SUBCUTANEOUS | 2 refills | Status: DC
  Filled 2023-05-28: qty 2, 28d supply, fill #0
  Filled 2023-06-28: qty 2, 28d supply, fill #1
  Filled 2023-07-25: qty 2, 28d supply, fill #2

## 2023-06-01 ENCOUNTER — Other Ambulatory Visit (HOSPITAL_COMMUNITY): Payer: Self-pay

## 2023-06-01 MED ORDER — HYDROCHLOROTHIAZIDE 50 MG PO TABS
50.0000 mg | ORAL_TABLET | Freq: Every day | ORAL | 1 refills | Status: DC
  Filled 2023-06-01: qty 90, 90d supply, fill #0
  Filled 2023-09-13: qty 90, 90d supply, fill #1

## 2023-06-02 ENCOUNTER — Other Ambulatory Visit (HOSPITAL_COMMUNITY): Payer: Self-pay

## 2023-06-04 ENCOUNTER — Other Ambulatory Visit (HOSPITAL_COMMUNITY): Payer: Self-pay

## 2023-06-28 ENCOUNTER — Other Ambulatory Visit (HOSPITAL_COMMUNITY): Payer: Self-pay

## 2023-06-29 ENCOUNTER — Other Ambulatory Visit (HOSPITAL_COMMUNITY): Payer: Self-pay

## 2023-06-29 ENCOUNTER — Other Ambulatory Visit: Payer: Self-pay

## 2023-06-29 MED ORDER — AMLODIPINE BESYLATE 10 MG PO TABS
10.0000 mg | ORAL_TABLET | Freq: Every day | ORAL | 0 refills | Status: DC
  Filled 2023-06-29: qty 100, 100d supply, fill #0

## 2023-07-15 ENCOUNTER — Other Ambulatory Visit (HOSPITAL_COMMUNITY): Payer: Self-pay

## 2023-07-16 ENCOUNTER — Other Ambulatory Visit (HOSPITAL_COMMUNITY): Payer: Self-pay

## 2023-07-16 MED ORDER — ATORVASTATIN CALCIUM 40 MG PO TABS
40.0000 mg | ORAL_TABLET | Freq: Every day | ORAL | 3 refills | Status: AC
  Filled 2023-07-16: qty 100, 100d supply, fill #0
  Filled 2023-10-24: qty 100, 100d supply, fill #1
  Filled 2024-01-24: qty 100, 100d supply, fill #2
  Filled 2024-05-01: qty 100, 100d supply, fill #3

## 2023-07-21 ENCOUNTER — Other Ambulatory Visit (HOSPITAL_COMMUNITY): Payer: Self-pay

## 2023-07-21 MED ORDER — METFORMIN HCL ER 500 MG PO TB24
1000.0000 mg | ORAL_TABLET | Freq: Two times a day (BID) | ORAL | 1 refills | Status: DC
  Filled 2023-07-21: qty 360, 90d supply, fill #0
  Filled 2023-10-24: qty 360, 90d supply, fill #1

## 2023-07-21 MED ORDER — VALSARTAN 320 MG PO TABS
320.0000 mg | ORAL_TABLET | Freq: Every day | ORAL | 3 refills | Status: AC
  Filled 2023-07-21: qty 100, 100d supply, fill #0
  Filled 2023-12-20: qty 100, 100d supply, fill #1
  Filled 2024-03-26: qty 100, 100d supply, fill #2

## 2023-07-28 ENCOUNTER — Other Ambulatory Visit (HOSPITAL_COMMUNITY): Payer: Self-pay

## 2023-07-31 ENCOUNTER — Other Ambulatory Visit (HOSPITAL_COMMUNITY): Payer: Self-pay

## 2023-08-17 ENCOUNTER — Other Ambulatory Visit (HOSPITAL_COMMUNITY): Payer: Self-pay

## 2023-08-17 MED ORDER — BLOOD GLUCOSE METER KIT
PACK | 0 refills | Status: AC
  Filled 2023-08-17: qty 1, 30d supply, fill #0

## 2023-08-17 MED ORDER — ACCU-CHEK GUIDE TEST VI STRP
ORAL_STRIP | 5 refills | Status: DC
  Filled 2023-08-17: qty 100, 100d supply, fill #0
  Filled 2024-01-24: qty 100, 100d supply, fill #1

## 2023-08-17 MED ORDER — LANCETS 33G MISC
1.0000 | Freq: Every day | 5 refills | Status: AC
  Filled 2023-08-17: qty 100, 100d supply, fill #0
  Filled 2024-01-24: qty 100, 100d supply, fill #1
  Filled 2024-04-30 (×2): qty 100, 100d supply, fill #2

## 2023-08-18 ENCOUNTER — Other Ambulatory Visit (HOSPITAL_COMMUNITY): Payer: Self-pay

## 2023-08-28 ENCOUNTER — Other Ambulatory Visit (HOSPITAL_COMMUNITY): Payer: Self-pay

## 2023-08-30 ENCOUNTER — Other Ambulatory Visit (HOSPITAL_COMMUNITY): Payer: Self-pay

## 2023-08-30 MED ORDER — DULAGLUTIDE 1.5 MG/0.5ML ~~LOC~~ SOAJ
1.5000 mg | SUBCUTANEOUS | 2 refills | Status: DC
  Filled 2023-08-30: qty 2, 28d supply, fill #0
  Filled 2023-09-26: qty 2, 28d supply, fill #1
  Filled 2023-10-24: qty 2, 28d supply, fill #2

## 2023-09-30 ENCOUNTER — Other Ambulatory Visit (HOSPITAL_COMMUNITY): Payer: Self-pay

## 2023-10-01 ENCOUNTER — Other Ambulatory Visit (HOSPITAL_COMMUNITY): Payer: Self-pay

## 2023-10-05 ENCOUNTER — Other Ambulatory Visit (HOSPITAL_COMMUNITY): Payer: Self-pay

## 2023-10-10 ENCOUNTER — Other Ambulatory Visit (HOSPITAL_COMMUNITY): Payer: Self-pay

## 2023-10-11 ENCOUNTER — Other Ambulatory Visit (HOSPITAL_COMMUNITY): Payer: Self-pay

## 2023-10-11 MED ORDER — AMLODIPINE BESYLATE 10 MG PO TABS
10.0000 mg | ORAL_TABLET | Freq: Every day | ORAL | 0 refills | Status: DC
  Filled 2023-10-11: qty 100, 100d supply, fill #0

## 2023-10-11 MED ORDER — MELOXICAM 15 MG PO TABS
15.0000 mg | ORAL_TABLET | Freq: Every day | ORAL | 0 refills | Status: AC
  Filled 2023-10-11: qty 30, 30d supply, fill #0

## 2023-10-14 ENCOUNTER — Other Ambulatory Visit (HOSPITAL_COMMUNITY): Payer: Self-pay

## 2023-11-11 ENCOUNTER — Other Ambulatory Visit (HOSPITAL_COMMUNITY): Payer: Self-pay

## 2023-11-18 ENCOUNTER — Other Ambulatory Visit (HOSPITAL_COMMUNITY): Payer: Self-pay

## 2023-11-23 ENCOUNTER — Other Ambulatory Visit (HOSPITAL_COMMUNITY): Payer: Self-pay

## 2023-11-23 MED ORDER — DULAGLUTIDE 1.5 MG/0.5ML ~~LOC~~ SOAJ
1.5000 mg | SUBCUTANEOUS | 2 refills | Status: DC
  Filled 2023-11-23: qty 2, 28d supply, fill #0
  Filled 2023-12-20: qty 2, 28d supply, fill #1
  Filled 2024-01-24: qty 2, 28d supply, fill #2

## 2023-12-06 ENCOUNTER — Other Ambulatory Visit (HOSPITAL_COMMUNITY): Payer: Self-pay

## 2023-12-10 ENCOUNTER — Other Ambulatory Visit (HOSPITAL_COMMUNITY): Payer: Self-pay

## 2023-12-11 ENCOUNTER — Other Ambulatory Visit (HOSPITAL_COMMUNITY): Payer: Self-pay

## 2023-12-13 ENCOUNTER — Other Ambulatory Visit (HOSPITAL_COMMUNITY): Payer: Self-pay

## 2023-12-13 MED ORDER — HYDROCHLOROTHIAZIDE 50 MG PO TABS
50.0000 mg | ORAL_TABLET | Freq: Every day | ORAL | 1 refills | Status: DC
  Filled 2023-12-13: qty 100, 100d supply, fill #0
  Filled 2024-03-17: qty 80, 80d supply, fill #1

## 2024-01-01 ENCOUNTER — Other Ambulatory Visit (HOSPITAL_COMMUNITY): Payer: Self-pay

## 2024-01-03 ENCOUNTER — Other Ambulatory Visit (HOSPITAL_COMMUNITY): Payer: Self-pay

## 2024-01-03 MED ORDER — POTASSIUM CHLORIDE ER 20 MEQ PO TBCR
1.0000 | EXTENDED_RELEASE_TABLET | Freq: Every day | ORAL | 0 refills | Status: DC
  Filled 2024-01-03: qty 100, 100d supply, fill #0

## 2024-01-03 MED ORDER — METFORMIN HCL ER 500 MG PO TB24
1000.0000 mg | ORAL_TABLET | Freq: Two times a day (BID) | ORAL | 1 refills | Status: AC
  Filled 2024-01-03: qty 360, 90d supply, fill #0
  Filled 2024-04-13: qty 360, 90d supply, fill #1

## 2024-01-12 ENCOUNTER — Other Ambulatory Visit (HOSPITAL_COMMUNITY): Payer: Self-pay

## 2024-01-12 MED ORDER — AMLODIPINE BESYLATE 10 MG PO TABS
10.0000 mg | ORAL_TABLET | Freq: Every day | ORAL | 0 refills | Status: DC
  Filled 2024-01-12: qty 100, 100d supply, fill #0

## 2024-01-25 ENCOUNTER — Other Ambulatory Visit: Payer: Self-pay

## 2024-01-25 ENCOUNTER — Other Ambulatory Visit (HOSPITAL_COMMUNITY): Payer: Self-pay

## 2024-01-26 ENCOUNTER — Other Ambulatory Visit (HOSPITAL_COMMUNITY): Payer: Self-pay

## 2024-02-01 ENCOUNTER — Other Ambulatory Visit (HOSPITAL_COMMUNITY): Payer: Self-pay

## 2024-02-01 MED ORDER — MELOXICAM 15 MG PO TABS
15.0000 mg | ORAL_TABLET | Freq: Every day | ORAL | 2 refills | Status: AC
  Filled 2024-02-01 – 2024-04-13 (×2): qty 30, 30d supply, fill #0
  Filled 2024-05-12: qty 30, 30d supply, fill #1
  Filled 2024-06-16: qty 30, 30d supply, fill #2

## 2024-02-01 MED ORDER — MELOXICAM 15 MG PO TABS
15.0000 mg | ORAL_TABLET | Freq: Every day | ORAL | 0 refills | Status: DC
  Filled 2024-02-01: qty 30, 30d supply, fill #0

## 2024-02-26 ENCOUNTER — Other Ambulatory Visit (HOSPITAL_COMMUNITY): Payer: Self-pay

## 2024-02-28 ENCOUNTER — Other Ambulatory Visit (HOSPITAL_COMMUNITY): Payer: Self-pay

## 2024-02-28 MED ORDER — DULAGLUTIDE 1.5 MG/0.5ML ~~LOC~~ SOAJ
1.5000 mg | SUBCUTANEOUS | 2 refills | Status: DC
  Filled 2024-02-28: qty 2, 28d supply, fill #0
  Filled 2024-03-25: qty 2, 28d supply, fill #1
  Filled 2024-04-23: qty 2, 28d supply, fill #2

## 2024-03-09 ENCOUNTER — Other Ambulatory Visit (HOSPITAL_COMMUNITY): Payer: Self-pay

## 2024-03-10 ENCOUNTER — Other Ambulatory Visit (HOSPITAL_COMMUNITY): Payer: Self-pay

## 2024-03-10 MED ORDER — MELOXICAM 15 MG PO TABS
ORAL_TABLET | ORAL | 0 refills | Status: AC
  Filled 2024-03-10: qty 30, 30d supply, fill #0

## 2024-03-17 ENCOUNTER — Other Ambulatory Visit (HOSPITAL_COMMUNITY): Payer: Self-pay

## 2024-04-05 ENCOUNTER — Other Ambulatory Visit (HOSPITAL_COMMUNITY): Payer: Self-pay

## 2024-04-06 ENCOUNTER — Other Ambulatory Visit (HOSPITAL_COMMUNITY): Payer: Self-pay

## 2024-04-06 MED ORDER — POTASSIUM CHLORIDE ER 20 MEQ PO TBCR
1.0000 | EXTENDED_RELEASE_TABLET | Freq: Every day | ORAL | 0 refills | Status: AC
  Filled 2024-04-06: qty 100, 100d supply, fill #0

## 2024-04-08 ENCOUNTER — Other Ambulatory Visit (HOSPITAL_COMMUNITY): Payer: Self-pay

## 2024-04-13 ENCOUNTER — Other Ambulatory Visit: Payer: Self-pay

## 2024-04-13 ENCOUNTER — Other Ambulatory Visit (HOSPITAL_COMMUNITY): Payer: Self-pay

## 2024-04-23 ENCOUNTER — Other Ambulatory Visit (HOSPITAL_COMMUNITY): Payer: Self-pay

## 2024-04-24 ENCOUNTER — Other Ambulatory Visit (HOSPITAL_COMMUNITY): Payer: Self-pay

## 2024-04-24 ENCOUNTER — Other Ambulatory Visit: Payer: Self-pay

## 2024-04-24 MED ORDER — AMLODIPINE BESYLATE 10 MG PO TABS
10.0000 mg | ORAL_TABLET | Freq: Every day | ORAL | 0 refills | Status: AC
  Filled 2024-04-24: qty 100, 100d supply, fill #0

## 2024-04-30 ENCOUNTER — Other Ambulatory Visit (HOSPITAL_COMMUNITY): Payer: Self-pay

## 2024-05-01 ENCOUNTER — Other Ambulatory Visit: Payer: Self-pay

## 2024-05-20 ENCOUNTER — Other Ambulatory Visit (HOSPITAL_COMMUNITY): Payer: Self-pay

## 2024-05-22 ENCOUNTER — Other Ambulatory Visit (HOSPITAL_COMMUNITY): Payer: Self-pay

## 2024-05-22 MED ORDER — DULAGLUTIDE 1.5 MG/0.5ML ~~LOC~~ SOAJ
1.5000 mg | SUBCUTANEOUS | 2 refills | Status: AC
  Filled 2024-05-22: qty 2, 28d supply, fill #0
  Filled 2024-06-16: qty 2, 28d supply, fill #1

## 2024-06-06 ENCOUNTER — Other Ambulatory Visit: Payer: Self-pay

## 2024-06-06 ENCOUNTER — Other Ambulatory Visit (HOSPITAL_COMMUNITY): Payer: Self-pay

## 2024-06-06 MED ORDER — HYDROCHLOROTHIAZIDE 50 MG PO TABS
50.0000 mg | ORAL_TABLET | Freq: Every day | ORAL | 0 refills | Status: AC
  Filled 2024-06-06: qty 90, 90d supply, fill #0

## 2024-06-16 ENCOUNTER — Other Ambulatory Visit (HOSPITAL_COMMUNITY): Payer: Self-pay
